# Patient Record
Sex: Female | Born: 1950 | Race: White | Hispanic: No | Smoking: Current every day smoker
Health system: Southern US, Community
[De-identification: ages and names within clinical notes are randomized; demographics above are authoritative.]

## PROBLEM LIST (undated history)

## (undated) DIAGNOSIS — F419 Anxiety disorder, unspecified: Secondary | ICD-10-CM

## (undated) DIAGNOSIS — G43909 Migraine, unspecified, not intractable, without status migrainosus: Secondary | ICD-10-CM

## (undated) DIAGNOSIS — I6522 Occlusion and stenosis of left carotid artery: Secondary | ICD-10-CM

## (undated) DIAGNOSIS — F329 Major depressive disorder, single episode, unspecified: Secondary | ICD-10-CM

## (undated) DIAGNOSIS — G2581 Restless legs syndrome: Secondary | ICD-10-CM

## (undated) DIAGNOSIS — M1990 Unspecified osteoarthritis, unspecified site: Secondary | ICD-10-CM

## (undated) DIAGNOSIS — F32A Depression, unspecified: Secondary | ICD-10-CM

## (undated) DIAGNOSIS — E7800 Pure hypercholesterolemia, unspecified: Secondary | ICD-10-CM

## (undated) DIAGNOSIS — E039 Hypothyroidism, unspecified: Secondary | ICD-10-CM

## (undated) DIAGNOSIS — I10 Essential (primary) hypertension: Secondary | ICD-10-CM

## (undated) HISTORY — DX: Occlusion and stenosis of left carotid artery: I65.22

## (undated) HISTORY — PX: CHOLECYSTECTOMY: SHX55

## (undated) HISTORY — DX: Pure hypercholesterolemia, unspecified: E78.00

## (undated) HISTORY — DX: Migraine, unspecified, not intractable, without status migrainosus: G43.909

## (undated) HISTORY — PX: ABDOMINAL HYSTERECTOMY: SHX81

## (undated) HISTORY — DX: Anxiety disorder, unspecified: F41.9

## (undated) HISTORY — PX: TONSILLECTOMY: SUR1361

---

## 2000-02-04 HISTORY — PX: TOTAL KNEE ARTHROPLASTY: SHX125

## 2014-05-02 DIAGNOSIS — Z79899 Other long term (current) drug therapy: Secondary | ICD-10-CM | POA: Diagnosis not present

## 2014-05-02 DIAGNOSIS — F411 Generalized anxiety disorder: Secondary | ICD-10-CM | POA: Diagnosis not present

## 2014-05-02 DIAGNOSIS — G43001 Migraine without aura, not intractable, with status migrainosus: Secondary | ICD-10-CM | POA: Diagnosis not present

## 2014-05-02 DIAGNOSIS — M25562 Pain in left knee: Secondary | ICD-10-CM | POA: Diagnosis not present

## 2014-05-02 DIAGNOSIS — E039 Hypothyroidism, unspecified: Secondary | ICD-10-CM | POA: Diagnosis not present

## 2014-05-08 DIAGNOSIS — M25562 Pain in left knee: Secondary | ICD-10-CM | POA: Diagnosis not present

## 2014-05-08 DIAGNOSIS — M1712 Unilateral primary osteoarthritis, left knee: Secondary | ICD-10-CM | POA: Diagnosis not present

## 2014-05-12 DIAGNOSIS — M1712 Unilateral primary osteoarthritis, left knee: Secondary | ICD-10-CM | POA: Diagnosis not present

## 2014-05-12 DIAGNOSIS — M179 Osteoarthritis of knee, unspecified: Secondary | ICD-10-CM | POA: Diagnosis not present

## 2014-05-12 DIAGNOSIS — M25562 Pain in left knee: Secondary | ICD-10-CM | POA: Diagnosis not present

## 2014-05-15 DIAGNOSIS — M2342 Loose body in knee, left knee: Secondary | ICD-10-CM | POA: Diagnosis not present

## 2014-05-15 DIAGNOSIS — M1712 Unilateral primary osteoarthritis, left knee: Secondary | ICD-10-CM | POA: Diagnosis not present

## 2014-05-24 DIAGNOSIS — X58XXXA Exposure to other specified factors, initial encounter: Secondary | ICD-10-CM | POA: Diagnosis not present

## 2014-05-24 DIAGNOSIS — M94262 Chondromalacia, left knee: Secondary | ICD-10-CM | POA: Diagnosis not present

## 2014-05-24 DIAGNOSIS — Y929 Unspecified place or not applicable: Secondary | ICD-10-CM | POA: Diagnosis not present

## 2014-05-24 DIAGNOSIS — S83232A Complex tear of medial meniscus, current injury, left knee, initial encounter: Secondary | ICD-10-CM | POA: Diagnosis not present

## 2014-06-05 DIAGNOSIS — R262 Difficulty in walking, not elsewhere classified: Secondary | ICD-10-CM | POA: Diagnosis not present

## 2014-06-05 DIAGNOSIS — M25562 Pain in left knee: Secondary | ICD-10-CM | POA: Diagnosis not present

## 2014-06-05 DIAGNOSIS — M23204 Derangement of unspecified medial meniscus due to old tear or injury, left knee: Secondary | ICD-10-CM | POA: Diagnosis not present

## 2014-06-08 DIAGNOSIS — M25562 Pain in left knee: Secondary | ICD-10-CM | POA: Diagnosis not present

## 2014-06-08 DIAGNOSIS — M23204 Derangement of unspecified medial meniscus due to old tear or injury, left knee: Secondary | ICD-10-CM | POA: Diagnosis not present

## 2014-06-08 DIAGNOSIS — R262 Difficulty in walking, not elsewhere classified: Secondary | ICD-10-CM | POA: Diagnosis not present

## 2014-06-13 DIAGNOSIS — M23204 Derangement of unspecified medial meniscus due to old tear or injury, left knee: Secondary | ICD-10-CM | POA: Diagnosis not present

## 2014-06-13 DIAGNOSIS — R262 Difficulty in walking, not elsewhere classified: Secondary | ICD-10-CM | POA: Diagnosis not present

## 2014-06-13 DIAGNOSIS — M25562 Pain in left knee: Secondary | ICD-10-CM | POA: Diagnosis not present

## 2014-07-20 DIAGNOSIS — M25562 Pain in left knee: Secondary | ICD-10-CM | POA: Diagnosis not present

## 2014-07-20 DIAGNOSIS — Z9889 Other specified postprocedural states: Secondary | ICD-10-CM | POA: Diagnosis not present

## 2014-09-25 ENCOUNTER — Other Ambulatory Visit: Payer: Self-pay | Admitting: Orthopedic Surgery

## 2014-10-05 ENCOUNTER — Encounter (HOSPITAL_COMMUNITY)
Admission: RE | Admit: 2014-10-05 | Discharge: 2014-10-05 | Disposition: A | Discharge status: Home / Self Care | Payer: Medicare HMO | Source: Ambulatory Visit | Attending: Orthopedic Surgery | Admitting: Orthopedic Surgery

## 2014-10-05 ENCOUNTER — Encounter (HOSPITAL_COMMUNITY): Payer: Self-pay

## 2014-10-05 ENCOUNTER — Ambulatory Visit (HOSPITAL_COMMUNITY)
Admission: RE | Admit: 2014-10-05 | Discharge: 2014-10-05 | Disposition: A | Discharge status: Home / Self Care | Payer: Medicare HMO | Source: Ambulatory Visit | Attending: Orthopedic Surgery | Admitting: Orthopedic Surgery

## 2014-10-05 DIAGNOSIS — Z01818 Encounter for other preprocedural examination: Secondary | ICD-10-CM | POA: Insufficient documentation

## 2014-10-05 DIAGNOSIS — F17200 Nicotine dependence, unspecified, uncomplicated: Secondary | ICD-10-CM | POA: Insufficient documentation

## 2014-10-05 DIAGNOSIS — R9431 Abnormal electrocardiogram [ECG] [EKG]: Secondary | ICD-10-CM | POA: Insufficient documentation

## 2014-10-05 DIAGNOSIS — Z7982 Long term (current) use of aspirin: Secondary | ICD-10-CM | POA: Diagnosis not present

## 2014-10-05 DIAGNOSIS — Z79899 Other long term (current) drug therapy: Secondary | ICD-10-CM | POA: Diagnosis not present

## 2014-10-05 DIAGNOSIS — Z01812 Encounter for preprocedural laboratory examination: Secondary | ICD-10-CM | POA: Insufficient documentation

## 2014-10-05 DIAGNOSIS — I10 Essential (primary) hypertension: Secondary | ICD-10-CM | POA: Diagnosis not present

## 2014-10-05 DIAGNOSIS — E039 Hypothyroidism, unspecified: Secondary | ICD-10-CM | POA: Diagnosis not present

## 2014-10-05 HISTORY — DX: Restless legs syndrome: G25.81

## 2014-10-05 HISTORY — DX: Depression, unspecified: F32.A

## 2014-10-05 HISTORY — DX: Hypothyroidism, unspecified: E03.9

## 2014-10-05 HISTORY — DX: Unspecified osteoarthritis, unspecified site: M19.90

## 2014-10-05 HISTORY — DX: Major depressive disorder, single episode, unspecified: F32.9

## 2014-10-05 HISTORY — DX: Essential (primary) hypertension: I10

## 2014-10-05 LAB — COMPREHENSIVE METABOLIC PANEL
ALK PHOS: 134 U/L — AB (ref 38–126)
ALT: 17 U/L (ref 14–54)
AST: 28 U/L (ref 15–41)
Albumin: 4.1 g/dL (ref 3.5–5.0)
Anion gap: 10 (ref 5–15)
BILIRUBIN TOTAL: 0.6 mg/dL (ref 0.3–1.2)
BUN: 26 mg/dL — AB (ref 6–20)
CALCIUM: 9.9 mg/dL (ref 8.9–10.3)
CO2: 24 mmol/L (ref 22–32)
Chloride: 104 mmol/L (ref 101–111)
Creatinine, Ser: 1.99 mg/dL — ABNORMAL HIGH (ref 0.44–1.00)
GFR calc Af Amer: 29 mL/min — ABNORMAL LOW (ref >60)
GFR, EST NON AFRICAN AMERICAN: 25 mL/min — AB (ref >60)
GLUCOSE: 105 mg/dL — AB (ref 65–99)
POTASSIUM: 3.6 mmol/L (ref 3.5–5.1)
Sodium: 138 mmol/L (ref 135–145)
TOTAL PROTEIN: 8 g/dL (ref 6.5–8.1)

## 2014-10-05 LAB — URINALYSIS, ROUTINE W REFLEX MICROSCOPIC
BILIRUBIN URINE: NEGATIVE
GLUCOSE, UA: NEGATIVE mg/dL
Ketones, ur: NEGATIVE mg/dL
Nitrite: POSITIVE — AB
PH: 5.5 (ref 5.0–8.0)
Protein, ur: NEGATIVE mg/dL
SPECIFIC GRAVITY, URINE: 1.014 (ref 1.005–1.030)
UROBILINOGEN UA: 0.2 mg/dL (ref 0.0–1.0)

## 2014-10-05 LAB — CBC WITH DIFFERENTIAL/PLATELET
BASOS ABS: 0 10*3/uL (ref 0.0–0.1)
Basophils Relative: 0 % (ref 0–1)
Eosinophils Absolute: 0.1 10*3/uL (ref 0.0–0.7)
Eosinophils Relative: 1 % (ref 0–5)
HEMATOCRIT: 40.7 % (ref 36.0–46.0)
HEMOGLOBIN: 13.9 g/dL (ref 12.0–15.0)
LYMPHS PCT: 30 % (ref 12–46)
Lymphs Abs: 2.4 10*3/uL (ref 0.7–4.0)
MCH: 31.8 pg (ref 26.0–34.0)
MCHC: 34.2 g/dL (ref 30.0–36.0)
MCV: 93.1 fL (ref 78.0–100.0)
MONO ABS: 0.7 10*3/uL (ref 0.1–1.0)
MONOS PCT: 9 % (ref 3–12)
NEUTROS ABS: 4.7 10*3/uL (ref 1.7–7.7)
NEUTROS PCT: 60 % (ref 43–77)
Platelets: 349 10*3/uL (ref 150–400)
RBC: 4.37 MIL/uL (ref 3.87–5.11)
RDW: 14.1 % (ref 11.5–15.5)
WBC: 7.9 10*3/uL (ref 4.0–10.5)

## 2014-10-05 LAB — URINE MICROSCOPIC-ADD ON

## 2014-10-05 LAB — SURGICAL PCR SCREEN
MRSA, PCR: NEGATIVE
Staphylococcus aureus: POSITIVE — AB

## 2014-10-05 LAB — PROTIME-INR
INR: 1.1 (ref 0.00–1.49)
PROTHROMBIN TIME: 14.4 s (ref 11.6–15.2)

## 2014-10-05 LAB — APTT: aPTT: 35 seconds (ref 24–37)

## 2014-10-05 NOTE — Pre-Procedure Instructions (Signed)
Shelley Underwood  10/05/2014     No Pharmacies Listed   Your procedure is scheduled on  Monday  10/16/14  Report to [facility] Admitting at 645 A.M.  Call this number if you have problems the morning of surgery:  [phone]   Remember:  Do not eat food or drink liquids after midnight Sunday Sept. 11  Take these medicines the morning of surgery with A SIP OF WATER :wellbutrin, lexapro,levothyroxine, metoprolol,    Do not wear jewelry, make-up or nail polish.  Do not wear lotions, powders, or perfumes.  You may wear deodorant.  Do not shave 48 hours prior to surgery.  Men may shave face and neck.  Do not bring valuables to the hospital.  [facility] is not responsible for any belongings or valuables.  Contacts, dentures or bridgework may not be worn into surgery.  Leave your suitcase in the car.  After surgery it may be brought to your room.  For patients admitted to the hospital, discharge time will be determined by your treatment team.  Patients discharged the day of surgery will not be allowed to drive home.   Name and phone number of your driver:   Special instructions:  [facility] - Preparing for Surgery  Before surgery, you can play an important role.  Because skin is not sterile, your skin needs to be as free of germs as possible.  You can reduce the number of germs on you skin by washing with CHG (chlorahexidine gluconate) soap before surgery.  CHG is an antiseptic cleaner which kills germs and bonds with the skin to continue killing germs even after washing.  Please DO NOT use if you have an allergy to CHG or antibacterial soaps.  If your skin becomes reddened/irritated stop using the CHG and inform your nurse when you arrive at Short Stay.  Do not shave (including legs and underarms) for at least 48 hours prior to the first CHG shower.  You may shave your face.  Please follow these instructions carefully:   1.  Shower with CHG Soap the night before  surgery and the                                morning of Surgery.  2.  If you choose to wash your hair, wash your hair first as usual with your       normal shampoo.  3.  After you shampoo, rinse your hair and body thoroughly to remove the                      Shampoo.  4.  Use CHG as you would any other liquid soap.  You can apply chg directly       to the skin and wash gently with scrungie or a clean washcloth.  5.  Apply the CHG Soap to your body ONLY FROM THE NECK DOWN.        Do not use on open wounds or open sores.  Avoid contact with your eyes,       ears, mouth and genitals (private parts).  Wash genitals (private parts)       with your normal soap.  6.  Wash thoroughly, paying special attention to the area where your surgery        will be performed.  7.  Thoroughly rinse your body with warm water from the  neck down.  8.  DO NOT shower/wash with your normal soap after using and rinsing off       the CHG Soap.  9.  Pat yourself dry with a clean towel.            10.  Wear clean pajamas.            11.  Place clean sheets on your bed the night of your first shower and do not        sleep with pets.  Day of Surgery  Do not apply any lotions/deoderants the morning of surgery.  Please wear clean clothes to the hospital/surgery center.    Please read over the following fact sheets that you were given. Pain Booklet, Coughing and Deep Breathing, MRSA Information and Surgical Site Infection Prevention

## 2014-10-05 NOTE — Progress Notes (Signed)
PCP: Dr. Valentino Nose [facility] in [facility]  Pt. States she saw a Dr. Louis Meckel, [facility] in [place]  5-6 yrs. Ago for  irregular heartbeat. Had echo/stress and sent to [facility] for cath. States everything normal and has not seen this doctor since. Will request records.

## 2014-10-06 ENCOUNTER — Encounter (HOSPITAL_COMMUNITY): Payer: Self-pay | Admitting: Emergency Medicine

## 2014-10-06 NOTE — Progress Notes (Signed)
Anesthesia Chart Review:  Pt is 64 year old female scheduled for L total knee arthroplasty on 10/16/2014 with Dr. Sherlean Foot.     PMH includes: HTN, hypothyroidism. Current smoker. BMI 37.   Medications include: ASA, lipitor, levothyroxine, metoprolol, mirapex, maxzide.   Preoperative labs reviewed.  Cr 1.99, BUN 26. Attempting to get comparison labs from PCP's office. Pt has UTI, notified receptionist in Dr. Tobin Chad office who will report result to PA.   Chest x-ray 10/05/2014 reviewed. There is no active cardiopulmonary disease.   EKG 10/05/2014: NSR. Nonspecific T wave abnormality  Pt reports had cardiac testing 5-6 years ago. Attempting to obtain records.   Will revisit chart when more information available.   Rica Mast, FNP-BC [facility]/Anesthesiology Phone: [phone] 10/06/2014 2:56 PM

## 2014-10-07 LAB — URINE CULTURE

## 2014-10-16 ENCOUNTER — Inpatient Hospital Stay (HOSPITAL_COMMUNITY): Admission: RE | Admit: 2014-10-16 | Payer: Medicare HMO | Source: Ambulatory Visit | Admitting: Orthopedic Surgery

## 2014-10-16 ENCOUNTER — Encounter (HOSPITAL_COMMUNITY): Admission: RE | Payer: Self-pay | Source: Ambulatory Visit

## 2014-10-16 SURGERY — ARTHROPLASTY, KNEE, TOTAL
Anesthesia: Spinal | Laterality: Left

## 2014-10-20 ENCOUNTER — Other Ambulatory Visit: Payer: Self-pay | Admitting: Orthopedic Surgery

## 2014-10-27 ENCOUNTER — Encounter (HOSPITAL_COMMUNITY)
Admission: RE | Admit: 2014-10-27 | Discharge: 2014-10-27 | Disposition: A | Discharge status: Home / Self Care | Payer: Medicare HMO | Source: Ambulatory Visit | Attending: Orthopedic Surgery | Admitting: Orthopedic Surgery

## 2014-10-27 ENCOUNTER — Encounter (HOSPITAL_COMMUNITY): Payer: Self-pay

## 2014-10-27 DIAGNOSIS — M1712 Unilateral primary osteoarthritis, left knee: Secondary | ICD-10-CM | POA: Insufficient documentation

## 2014-10-27 DIAGNOSIS — Z01812 Encounter for preprocedural laboratory examination: Secondary | ICD-10-CM | POA: Insufficient documentation

## 2014-10-27 LAB — COMPREHENSIVE METABOLIC PANEL
ALT: 11 U/L — ABNORMAL LOW (ref 14–54)
ANION GAP: 7 (ref 5–15)
AST: 21 U/L (ref 15–41)
Albumin: 3.5 g/dL (ref 3.5–5.0)
Alkaline Phosphatase: 102 U/L (ref 38–126)
BILIRUBIN TOTAL: 0.3 mg/dL (ref 0.3–1.2)
BUN: 13 mg/dL (ref 6–20)
CO2: 22 mmol/L (ref 22–32)
Calcium: 9.3 mg/dL (ref 8.9–10.3)
Chloride: 110 mmol/L (ref 101–111)
Creatinine, Ser: 0.99 mg/dL (ref 0.44–1.00)
GFR, EST NON AFRICAN AMERICAN: 59 mL/min — AB (ref >60)
Glucose, Bld: 96 mg/dL (ref 65–99)
POTASSIUM: 4.9 mmol/L (ref 3.5–5.1)
Sodium: 139 mmol/L (ref 135–145)
TOTAL PROTEIN: 6.6 g/dL (ref 6.5–8.1)

## 2014-10-27 LAB — CBC WITH DIFFERENTIAL/PLATELET
Basophils Absolute: 0 10*3/uL (ref 0.0–0.1)
Basophils Relative: 0 %
EOS PCT: 2 %
Eosinophils Absolute: 0.1 10*3/uL (ref 0.0–0.7)
HEMATOCRIT: 36.1 % (ref 36.0–46.0)
Hemoglobin: 12 g/dL (ref 12.0–15.0)
LYMPHS ABS: 2.3 10*3/uL (ref 0.7–4.0)
LYMPHS PCT: 30 %
MCH: 31.9 pg (ref 26.0–34.0)
MCHC: 33.2 g/dL (ref 30.0–36.0)
MCV: 96 fL (ref 78.0–100.0)
MONO ABS: 0.4 10*3/uL (ref 0.1–1.0)
MONOS PCT: 6 %
NEUTROS ABS: 4.8 10*3/uL (ref 1.7–7.7)
Neutrophils Relative %: 62 %
Platelets: 247 10*3/uL (ref 150–400)
RBC: 3.76 MIL/uL — ABNORMAL LOW (ref 3.87–5.11)
RDW: 14.1 % (ref 11.5–15.5)
WBC: 7.7 10*3/uL (ref 4.0–10.5)

## 2014-10-27 LAB — URINALYSIS, ROUTINE W REFLEX MICROSCOPIC
BILIRUBIN URINE: NEGATIVE
Glucose, UA: NEGATIVE mg/dL
HGB URINE DIPSTICK: NEGATIVE
Ketones, ur: NEGATIVE mg/dL
Nitrite: NEGATIVE
Protein, ur: NEGATIVE mg/dL
SPECIFIC GRAVITY, URINE: 1.025 (ref 1.005–1.030)
UROBILINOGEN UA: 0.2 mg/dL (ref 0.0–1.0)
pH: 5 (ref 5.0–8.0)

## 2014-10-27 LAB — URINE MICROSCOPIC-ADD ON

## 2014-10-27 LAB — PROTIME-INR
INR: 0.99 (ref 0.00–1.49)
PROTHROMBIN TIME: 13.3 s (ref 11.6–15.2)

## 2014-10-27 LAB — SURGICAL PCR SCREEN
MRSA, PCR: NEGATIVE
Staphylococcus aureus: NEGATIVE

## 2014-10-27 LAB — APTT: aPTT: 35 seconds (ref 24–37)

## 2014-10-27 NOTE — Progress Notes (Signed)
Pt. Does not have cardiologist. PCP: Dr. Leonor Liv at [facility] in [place]  Stated the echo/stress cath was done over 10 yrs. Ago, normal.

## 2014-10-27 NOTE — Pre-Procedure Instructions (Signed)
    Shelley Underwood  10/27/2014      [facility] - [place], [place] - [address] [place] [address] Phone: [phone] Fax: [phone]    Your procedure is scheduled on Monday, Oct. 3  Report to [facility] Admitting at 6:45 A.M.  Call this number if you have problems the morning of surgery:  [phone]              Any questions prior to surgery [phone] (Monday-Friday 8am-4pm)   Remember:  Do not eat food or drink liquids after midnight.  Take these medicines the morning of surgery with A SIP OF WATER : bupropion (wellbutrin), escitalopram (lexapro), levothyroxine (synthroid), metoprolol(lopressor)              Stop aspirin,omega-3 acid (lovaza) 1 week prior to surgery. Also stop nonsteroidal anti-inflammatoy medications: advil, motrin, ibuprofen, BC'S   Do not wear jewelry, make-up or nail polish.  Do not wear lotions, powders, or perfumes.  You may wear deodorant.  Do not shave 48 hours prior to surgery.  Men may shave face and neck.  Do not bring valuables to the hospital.  [facility] is not responsible for any belongings or valuables.  Contacts, dentures or bridgework may not be worn into surgery.  Leave your suitcase in the car.  After surgery it may be brought to your room.  For patients admitted to the hospital, discharge time will be determined by your treatment team.  Patients discharged the day of surgery will not be allowed to drive home.   Name and phone number of your driver:    Special instructions: review preparing for surgery handout  Please read over the following fact sheets that you were given. Pain Booklet, Coughing and Deep Breathing, MRSA Information and Surgical Site Infection Prevention

## 2014-10-28 LAB — URINE CULTURE

## 2014-11-05 MED ORDER — BUPIVACAINE LIPOSOME 1.3 % IJ SUSP
20.0000 mL | Freq: Once | INTRAMUSCULAR | Status: AC
  Administered 2014-11-06: 20 mL
  Filled 2014-11-05: qty 20

## 2014-11-05 MED ORDER — TRANEXAMIC ACID 1000 MG/10ML IV SOLN
1000.0000 mg | INTRAVENOUS | Status: AC
  Administered 2014-11-06: 1000 mg via INTRAVENOUS
  Filled 2014-11-05: qty 10

## 2014-11-06 ENCOUNTER — Encounter (HOSPITAL_COMMUNITY)
Admission: RE | Disposition: A | Discharge status: Home Health | Payer: Self-pay | Source: Ambulatory Visit | Attending: Orthopedic Surgery

## 2014-11-06 ENCOUNTER — Inpatient Hospital Stay (HOSPITAL_COMMUNITY): Payer: Medicare HMO | Admitting: Certified Registered Nurse Anesthetist

## 2014-11-06 ENCOUNTER — Inpatient Hospital Stay (HOSPITAL_COMMUNITY)
Admission: RE | Admit: 2014-11-06 | Discharge: 2014-11-08 | DRG: 470 | Disposition: A | Discharge status: Home Health | Payer: Medicare HMO | Source: Ambulatory Visit | Attending: Orthopedic Surgery | Admitting: Orthopedic Surgery

## 2014-11-06 DIAGNOSIS — Z96659 Presence of unspecified artificial knee joint: Secondary | ICD-10-CM

## 2014-11-06 DIAGNOSIS — Z96651 Presence of right artificial knee joint: Secondary | ICD-10-CM | POA: Diagnosis present

## 2014-11-06 DIAGNOSIS — D62 Acute posthemorrhagic anemia: Secondary | ICD-10-CM | POA: Diagnosis not present

## 2014-11-06 DIAGNOSIS — J449 Chronic obstructive pulmonary disease, unspecified: Secondary | ICD-10-CM | POA: Diagnosis present

## 2014-11-06 DIAGNOSIS — M1712 Unilateral primary osteoarthritis, left knee: Secondary | ICD-10-CM | POA: Diagnosis present

## 2014-11-06 DIAGNOSIS — Z6838 Body mass index (BMI) 38.0-38.9, adult: Secondary | ICD-10-CM

## 2014-11-06 DIAGNOSIS — F17210 Nicotine dependence, cigarettes, uncomplicated: Secondary | ICD-10-CM | POA: Diagnosis present

## 2014-11-06 DIAGNOSIS — I10 Essential (primary) hypertension: Secondary | ICD-10-CM | POA: Diagnosis present

## 2014-11-06 DIAGNOSIS — E039 Hypothyroidism, unspecified: Secondary | ICD-10-CM | POA: Diagnosis not present

## 2014-11-06 DIAGNOSIS — Z880 Allergy status to penicillin: Secondary | ICD-10-CM

## 2014-11-06 DIAGNOSIS — Z885 Allergy status to narcotic agent status: Secondary | ICD-10-CM | POA: Diagnosis not present

## 2014-11-06 DIAGNOSIS — Z881 Allergy status to other antibiotic agents status: Secondary | ICD-10-CM | POA: Diagnosis not present

## 2014-11-06 DIAGNOSIS — G2581 Restless legs syndrome: Secondary | ICD-10-CM | POA: Diagnosis not present

## 2014-11-06 DIAGNOSIS — M25562 Pain in left knee: Secondary | ICD-10-CM | POA: Diagnosis present

## 2014-11-06 HISTORY — PX: TOTAL KNEE ARTHROPLASTY: SHX125

## 2014-11-06 LAB — CBC
HEMATOCRIT: 35.8 % — AB (ref 36.0–46.0)
HEMOGLOBIN: 11.4 g/dL — AB (ref 12.0–15.0)
MCH: 30.4 pg (ref 26.0–34.0)
MCHC: 31.8 g/dL (ref 30.0–36.0)
MCV: 95.5 fL (ref 78.0–100.0)
Platelets: 271 10*3/uL (ref 150–400)
RBC: 3.75 MIL/uL — ABNORMAL LOW (ref 3.87–5.11)
RDW: 14.1 % (ref 11.5–15.5)
WBC: 9.3 10*3/uL (ref 4.0–10.5)

## 2014-11-06 LAB — CREATININE, SERUM
Creatinine, Ser: 0.89 mg/dL (ref 0.44–1.00)
GFR calc Af Amer: >60 mL/min (ref >60)

## 2014-11-06 SURGERY — ARTHROPLASTY, KNEE, TOTAL
Anesthesia: Spinal | Site: Knee | Laterality: Left

## 2014-11-06 MED ORDER — PHENYLEPHRINE 40 MCG/ML (10ML) SYRINGE FOR IV PUSH (FOR BLOOD PRESSURE SUPPORT)
PREFILLED_SYRINGE | INTRAVENOUS | Status: AC
  Filled 2014-11-06: qty 10

## 2014-11-06 MED ORDER — SENNOSIDES-DOCUSATE SODIUM 8.6-50 MG PO TABS: 1.0000 | ORAL_TABLET | Freq: Every evening | ORAL | Status: DC | PRN

## 2014-11-06 MED ORDER — PROPOFOL 10 MG/ML IV BOLUS
INTRAVENOUS | Status: AC
  Filled 2014-11-06: qty 20

## 2014-11-06 MED ORDER — CELECOXIB 200 MG PO CAPS
200.0000 mg | ORAL_CAPSULE | Freq: Two times a day (BID) | ORAL | Status: DC
Start: 2014-11-06 — End: 2014-11-08
  Administered 2014-11-06 – 2014-11-08 (×5): 200 mg via ORAL
  Filled 2014-11-06 (×5): qty 1

## 2014-11-06 MED ORDER — ONDANSETRON HCL 4 MG/2ML IJ SOLN
INTRAMUSCULAR | Status: DC | PRN
  Administered 2014-11-06: 4 mg via INTRAVENOUS

## 2014-11-06 MED ORDER — MIDAZOLAM HCL 2 MG/2ML IJ SOLN
INTRAMUSCULAR | Status: AC
  Filled 2014-11-06: qty 4

## 2014-11-06 MED ORDER — DOCUSATE SODIUM 100 MG PO CAPS
100.0000 mg | ORAL_CAPSULE | Freq: Two times a day (BID) | ORAL | Status: DC
Start: 2014-11-06 — End: 2014-11-08
  Administered 2014-11-06 – 2014-11-08 (×5): 100 mg via ORAL
  Filled 2014-11-06 (×5): qty 1

## 2014-11-06 MED ORDER — MENTHOL 3 MG MT LOZG: 1.0000 | LOZENGE | OROMUCOSAL | Status: DC | PRN

## 2014-11-06 MED ORDER — HYDROMORPHONE HCL 1 MG/ML IJ SOLN
0.2500 mg | INTRAMUSCULAR | Status: DC | PRN
  Administered 2014-11-06: 0.5 mg via INTRAVENOUS

## 2014-11-06 MED ORDER — ACETAMINOPHEN 650 MG RE SUPP: 650.0000 mg | Freq: Four times a day (QID) | RECTAL | Status: DC | PRN

## 2014-11-06 MED ORDER — HYDROMORPHONE HCL 1 MG/ML IJ SOLN
INTRAMUSCULAR | Status: AC
  Filled 2014-11-06: qty 1

## 2014-11-06 MED ORDER — CHLORHEXIDINE GLUCONATE 4 % EX LIQD: 60.0000 mL | Freq: Once | CUTANEOUS | Status: DC

## 2014-11-06 MED ORDER — ACETAMINOPHEN 325 MG PO TABS: 650.0000 mg | ORAL_TABLET | Freq: Four times a day (QID) | ORAL | Status: DC | PRN

## 2014-11-06 MED ORDER — PRAMIPEXOLE DIHYDROCHLORIDE 0.125 MG PO TABS
0.5000 mg | ORAL_TABLET | Freq: Every day | ORAL | Status: DC
  Administered 2014-11-06 – 2014-11-07 (×2): 0.5 mg via ORAL
  Filled 2014-11-06 (×2): qty 4

## 2014-11-06 MED ORDER — BUPROPION HCL ER (XL) 150 MG PO TB24
150.0000 mg | ORAL_TABLET | Freq: Every day | ORAL | Status: DC
  Administered 2014-11-07 – 2014-11-08 (×2): 150 mg via ORAL
  Filled 2014-11-06 (×2): qty 1

## 2014-11-06 MED ORDER — ATORVASTATIN CALCIUM 10 MG PO TABS
20.0000 mg | ORAL_TABLET | Freq: Every day | ORAL | Status: DC
  Administered 2014-11-06 – 2014-11-07 (×2): 20 mg via ORAL
  Filled 2014-11-06 (×2): qty 2

## 2014-11-06 MED ORDER — HYDROMORPHONE HCL 1 MG/ML IJ SOLN
0.2500 mg | INTRAMUSCULAR | Status: DC | PRN
  Administered 2014-11-06 (×4): 0.5 mg via INTRAVENOUS

## 2014-11-06 MED ORDER — METOCLOPRAMIDE HCL 5 MG/ML IJ SOLN: 5.0000 mg | Freq: Three times a day (TID) | INTRAMUSCULAR | Status: DC | PRN

## 2014-11-06 MED ORDER — FENTANYL CITRATE (PF) 250 MCG/5ML IJ SOLN
INTRAMUSCULAR | Status: AC
  Filled 2014-11-06: qty 5

## 2014-11-06 MED ORDER — SODIUM CHLORIDE 0.9 % IV SOLN
INTRAVENOUS | Status: DC
Start: 2014-11-06 — End: 2014-11-08
  Administered 2014-11-07: 02:00:00 via INTRAVENOUS

## 2014-11-06 MED ORDER — ALUM & MAG HYDROXIDE-SIMETH 200-200-20 MG/5ML PO SUSP: 30.0000 mL | ORAL | Status: DC | PRN

## 2014-11-06 MED ORDER — METHOCARBAMOL 500 MG PO TABS
500.0000 mg | ORAL_TABLET | Freq: Four times a day (QID) | ORAL | Status: DC | PRN
  Administered 2014-11-06 – 2014-11-08 (×7): 500 mg via ORAL
  Filled 2014-11-06 (×7): qty 1

## 2014-11-06 MED ORDER — ONDANSETRON HCL 4 MG/2ML IJ SOLN: 4.0000 mg | Freq: Four times a day (QID) | INTRAMUSCULAR | Status: DC | PRN

## 2014-11-06 MED ORDER — LACTATED RINGERS IV SOLN
INTRAVENOUS | Status: DC | PRN
  Administered 2014-11-06: 09:00:00 via INTRAVENOUS

## 2014-11-06 MED ORDER — ENOXAPARIN SODIUM 30 MG/0.3ML ~~LOC~~ SOLN
30.0000 mg | Freq: Two times a day (BID) | SUBCUTANEOUS | Status: DC
  Administered 2014-11-07 – 2014-11-08 (×3): 30 mg via SUBCUTANEOUS
  Filled 2014-11-06 (×3): qty 0.3

## 2014-11-06 MED ORDER — ONDANSETRON HCL 4 MG/2ML IJ SOLN
INTRAMUSCULAR | Status: AC
  Filled 2014-11-06: qty 2

## 2014-11-06 MED ORDER — BUPIVACAINE-EPINEPHRINE 0.5% -1:200000 IJ SOLN
INTRAMUSCULAR | Status: DC | PRN
  Administered 2014-11-06: 50 mL

## 2014-11-06 MED ORDER — DEXAMETHASONE SODIUM PHOSPHATE 4 MG/ML IJ SOLN
INTRAMUSCULAR | Status: DC | PRN
  Administered 2014-11-06: 4 mg via INTRAVENOUS

## 2014-11-06 MED ORDER — INFLUENZA VAC SPLIT QUAD 0.5 ML IM SUSY
0.5000 mL | PREFILLED_SYRINGE | INTRAMUSCULAR | Status: AC
  Administered 2014-11-07: 0.5 mL via INTRAMUSCULAR
  Filled 2014-11-06: qty 0.5

## 2014-11-06 MED ORDER — HYDROMORPHONE HCL 1 MG/ML IJ SOLN
1.0000 mg | INTRAMUSCULAR | Status: DC | PRN
  Administered 2014-11-06 – 2014-11-08 (×17): 1 mg via INTRAVENOUS
  Filled 2014-11-06 (×18): qty 1

## 2014-11-06 MED ORDER — MIDAZOLAM HCL 5 MG/5ML IJ SOLN
INTRAMUSCULAR | Status: DC | PRN
  Administered 2014-11-06: 2 mg via INTRAVENOUS
  Administered 2014-11-06 (×2): 1 mg via INTRAVENOUS

## 2014-11-06 MED ORDER — MIDAZOLAM HCL 2 MG/2ML IJ SOLN
0.5000 mg | Freq: Once | INTRAMUSCULAR | Status: AC | PRN
  Administered 2014-11-06: 2 mg via INTRAVENOUS

## 2014-11-06 MED ORDER — METOCLOPRAMIDE HCL 5 MG PO TABS: 5.0000 mg | ORAL_TABLET | Freq: Three times a day (TID) | ORAL | Status: DC | PRN

## 2014-11-06 MED ORDER — EPHEDRINE SULFATE 50 MG/ML IJ SOLN
INTRAMUSCULAR | Status: DC | PRN
  Administered 2014-11-06: 10 mg via INTRAVENOUS
  Administered 2014-11-06 (×2): 5 mg via INTRAVENOUS
  Administered 2014-11-06: 10 mg via INTRAVENOUS

## 2014-11-06 MED ORDER — CEFAZOLIN SODIUM-DEXTROSE 2-3 GM-% IV SOLR
INTRAVENOUS | Status: AC
  Filled 2014-11-06: qty 50

## 2014-11-06 MED ORDER — FENTANYL CITRATE (PF) 100 MCG/2ML IJ SOLN
INTRAMUSCULAR | Status: DC | PRN
  Administered 2014-11-06: 25 ug via INTRAVENOUS
  Administered 2014-11-06 (×2): 50 ug via INTRAVENOUS
  Administered 2014-11-06: 200 ug via INTRAVENOUS
  Administered 2014-11-06: 25 ug via INTRAVENOUS
  Administered 2014-11-06 (×2): 50 ug via INTRAVENOUS
  Administered 2014-11-06 (×2): 25 ug via INTRAVENOUS

## 2014-11-06 MED ORDER — MIDAZOLAM HCL 2 MG/2ML IJ SOLN
INTRAMUSCULAR | Status: AC
  Filled 2014-11-06: qty 2

## 2014-11-06 MED ORDER — HYDROCODONE-ACETAMINOPHEN 10-325 MG PO TABS
1.0000 | ORAL_TABLET | ORAL | Status: DC | PRN
  Administered 2014-11-06 (×3): 1 via ORAL
  Administered 2014-11-07 – 2014-11-08 (×8): 2 via ORAL
  Filled 2014-11-06: qty 2
  Filled 2014-11-06 (×2): qty 1
  Filled 2014-11-06 (×8): qty 2

## 2014-11-06 MED ORDER — ROCURONIUM BROMIDE 50 MG/5ML IV SOLN
INTRAVENOUS | Status: AC
  Filled 2014-11-06: qty 1

## 2014-11-06 MED ORDER — STERILE WATER FOR INJECTION IJ SOLN
INTRAMUSCULAR | Status: AC
  Filled 2014-11-06: qty 10

## 2014-11-06 MED ORDER — ARTIFICIAL TEARS OP OINT
TOPICAL_OINTMENT | OPHTHALMIC | Status: AC
  Filled 2014-11-06: qty 3.5

## 2014-11-06 MED ORDER — METOPROLOL TARTRATE 100 MG PO TABS
100.0000 mg | ORAL_TABLET | Freq: Every day | ORAL | Status: DC
  Administered 2014-11-07 – 2014-11-08 (×2): 100 mg via ORAL
  Filled 2014-11-06 (×2): qty 1

## 2014-11-06 MED ORDER — SODIUM CHLORIDE 0.9 % IR SOLN
Status: DC | PRN
  Administered 2014-11-06: 3000 mL

## 2014-11-06 MED ORDER — DEXAMETHASONE SODIUM PHOSPHATE 4 MG/ML IJ SOLN
INTRAMUSCULAR | Status: AC
  Filled 2014-11-06: qty 1

## 2014-11-06 MED ORDER — LIDOCAINE HCL (CARDIAC) 20 MG/ML IV SOLN
INTRAVENOUS | Status: AC
  Filled 2014-11-06: qty 5

## 2014-11-06 MED ORDER — PROMETHAZINE HCL 25 MG/ML IJ SOLN
6.2500 mg | INTRAMUSCULAR | Status: DC | PRN
  Administered 2014-11-06: 6.25 mg via INTRAVENOUS

## 2014-11-06 MED ORDER — EPHEDRINE SULFATE 50 MG/ML IJ SOLN
INTRAMUSCULAR | Status: AC
  Filled 2014-11-06: qty 1

## 2014-11-06 MED ORDER — SUCCINYLCHOLINE CHLORIDE 20 MG/ML IJ SOLN
INTRAMUSCULAR | Status: AC
  Filled 2014-11-06: qty 1

## 2014-11-06 MED ORDER — CEFAZOLIN SODIUM-DEXTROSE 2-3 GM-% IV SOLR
2.0000 g | INTRAVENOUS | Status: AC
  Administered 2014-11-06: 2 g via INTRAVENOUS

## 2014-11-06 MED ORDER — ZOLPIDEM TARTRATE 5 MG PO TABS: 5.0000 mg | ORAL_TABLET | Freq: Every evening | ORAL | Status: DC | PRN

## 2014-11-06 MED ORDER — LEVOTHYROXINE SODIUM 112 MCG PO TABS
112.0000 ug | ORAL_TABLET | Freq: Every day | ORAL | Status: DC
  Administered 2014-11-07 – 2014-11-08 (×2): 112 ug via ORAL
  Filled 2014-11-06 (×2): qty 1

## 2014-11-06 MED ORDER — HYDROMORPHONE HCL 1 MG/ML IJ SOLN: 0.5000 mg | INTRAMUSCULAR | Status: DC | PRN

## 2014-11-06 MED ORDER — MEPERIDINE HCL 25 MG/ML IJ SOLN: 6.2500 mg | INTRAMUSCULAR | Status: DC | PRN

## 2014-11-06 MED ORDER — FLEET ENEMA 7-19 GM/118ML RE ENEM: 1.0000 | ENEMA | Freq: Once | RECTAL | Status: DC | PRN

## 2014-11-06 MED ORDER — PHENYLEPHRINE HCL 10 MG/ML IJ SOLN
INTRAMUSCULAR | Status: DC | PRN
  Administered 2014-11-06 (×2): 40 ug via INTRAVENOUS
  Administered 2014-11-06: 80 ug via INTRAVENOUS

## 2014-11-06 MED ORDER — BISACODYL 5 MG PO TBEC: 5.0000 mg | DELAYED_RELEASE_TABLET | Freq: Every day | ORAL | Status: DC | PRN

## 2014-11-06 MED ORDER — PROMETHAZINE HCL 25 MG/ML IJ SOLN
INTRAMUSCULAR | Status: AC
  Filled 2014-11-06: qty 1

## 2014-11-06 MED ORDER — PROPOFOL 10 MG/ML IV BOLUS
INTRAVENOUS | Status: DC | PRN
  Administered 2014-11-06: 10 mg via INTRAVENOUS
  Administered 2014-11-06 (×2): 20 mg via INTRAVENOUS
  Administered 2014-11-06: 250 mg via INTRAVENOUS

## 2014-11-06 MED ORDER — METHOCARBAMOL 1000 MG/10ML IJ SOLN
500.0000 mg | Freq: Four times a day (QID) | INTRAMUSCULAR | Status: DC | PRN
  Administered 2014-11-06: 500 mg via INTRAVENOUS
  Filled 2014-11-06 (×2): qty 5

## 2014-11-06 MED ORDER — ONDANSETRON HCL 4 MG PO TABS
4.0000 mg | ORAL_TABLET | Freq: Four times a day (QID) | ORAL | Status: DC | PRN
  Administered 2014-11-06: 4 mg via ORAL
  Filled 2014-11-06: qty 1

## 2014-11-06 MED ORDER — VANCOMYCIN HCL IN DEXTROSE 1-5 GM/200ML-% IV SOLN
1000.0000 mg | Freq: Two times a day (BID) | INTRAVENOUS | Status: AC
  Administered 2014-11-06: 1000 mg via INTRAVENOUS
  Filled 2014-11-06: qty 200

## 2014-11-06 MED ORDER — PHENOL 1.4 % MT LIQD: 1.0000 | OROMUCOSAL | Status: DC | PRN

## 2014-11-06 MED ORDER — SODIUM CHLORIDE 0.9 % IV SOLN: INTRAVENOUS | Status: DC

## 2014-11-06 MED ORDER — ESCITALOPRAM OXALATE 10 MG PO TABS
10.0000 mg | ORAL_TABLET | Freq: Every day | ORAL | Status: DC
  Administered 2014-11-07 – 2014-11-08 (×2): 10 mg via ORAL
  Filled 2014-11-06 (×2): qty 1

## 2014-11-06 MED ORDER — BUPIVACAINE-EPINEPHRINE (PF) 0.5% -1:200000 IJ SOLN
INTRAMUSCULAR | Status: AC
  Filled 2014-11-06: qty 30

## 2014-11-06 MED ORDER — DIPHENHYDRAMINE HCL 12.5 MG/5ML PO ELIX
12.5000 mg | ORAL_SOLUTION | ORAL | Status: DC | PRN
Start: 2014-11-06 — End: 2014-11-08

## 2014-11-06 SURGICAL SUPPLY — 61 items
BANDAGE ELASTIC 4 VELCRO ST LF (GAUZE/BANDAGES/DRESSINGS) ×3 IMPLANT
BANDAGE ELASTIC 6 VELCRO ST LF (GAUZE/BANDAGES/DRESSINGS) ×3 IMPLANT
BANDAGE ESMARK 6X9 LF (GAUZE/BANDAGES/DRESSINGS) ×1 IMPLANT
BLADE SAGITTAL 13X1.27X60 (BLADE) ×2 IMPLANT
BLADE SAGITTAL 13X1.27X60MM (BLADE) ×1
BLADE SAW SGTL 83.5X18.5 (BLADE) ×3 IMPLANT
BLADE SURG 10 STRL SS (BLADE) ×3 IMPLANT
BNDG ESMARK 6X9 LF (GAUZE/BANDAGES/DRESSINGS) ×3
BOWL SMART MIX CTS (DISPOSABLE) ×3 IMPLANT
CAPT KNEE TOTAL 3 ×3 IMPLANT
CEMENT BONE SIMPLEX SPEEDSET (Cement) ×6 IMPLANT
COVER SURGICAL LIGHT HANDLE (MISCELLANEOUS) ×3 IMPLANT
CUFF TOURNIQUET SINGLE 34IN LL (TOURNIQUET CUFF) ×3 IMPLANT
DRAPE EXTREMITY T 121X128X90 (DRAPE) ×3 IMPLANT
DRAPE INCISE IOBAN 66X45 STRL (DRAPES) ×6 IMPLANT
DRAPE PROXIMA HALF (DRAPES) IMPLANT
DRAPE U-SHAPE 47X51 STRL (DRAPES) ×3 IMPLANT
DRSG ADAPTIC 3X8 NADH LF (GAUZE/BANDAGES/DRESSINGS) ×3 IMPLANT
DRSG PAD ABDOMINAL 8X10 ST (GAUZE/BANDAGES/DRESSINGS) ×3 IMPLANT
DURAPREP 26ML APPLICATOR (WOUND CARE) ×6 IMPLANT
ELECT REM PT RETURN 9FT ADLT (ELECTROSURGICAL) ×3
ELECTRODE REM PT RTRN 9FT ADLT (ELECTROSURGICAL) ×1 IMPLANT
GAUZE SPONGE 4X4 12PLY STRL (GAUZE/BANDAGES/DRESSINGS) ×3 IMPLANT
GLOVE BIOGEL M 7.0 STRL (GLOVE) ×6 IMPLANT
GLOVE BIOGEL PI IND STRL 7.5 (GLOVE) ×2 IMPLANT
GLOVE BIOGEL PI IND STRL 8.5 (GLOVE) ×1 IMPLANT
GLOVE BIOGEL PI INDICATOR 7.5 (GLOVE) ×4
GLOVE BIOGEL PI INDICATOR 8.5 (GLOVE) ×2
GLOVE SURG ORTHO 8.0 STRL STRW (GLOVE) ×3 IMPLANT
GOWN STRL REUS W/ TWL LRG LVL3 (GOWN DISPOSABLE) ×1 IMPLANT
GOWN STRL REUS W/ TWL XL LVL3 (GOWN DISPOSABLE) ×2 IMPLANT
GOWN STRL REUS W/TWL LRG LVL3 (GOWN DISPOSABLE) ×2
GOWN STRL REUS W/TWL XL LVL3 (GOWN DISPOSABLE) ×4
HANDPIECE INTERPULSE COAX TIP (DISPOSABLE) ×2
HOOD PEEL AWAY FACE SHEILD DIS (HOOD) ×9 IMPLANT
KIT BASIN OR (CUSTOM PROCEDURE TRAY) ×3 IMPLANT
KIT ROOM TURNOVER OR (KITS) ×3 IMPLANT
KNEE CAPITATED TOTAL 3 ×1 IMPLANT
MANIFOLD NEPTUNE II (INSTRUMENTS) ×3 IMPLANT
NEEDLE 22X1 1/2 (OR ONLY) (NEEDLE) ×6 IMPLANT
NS IRRIG 1000ML POUR BTL (IV SOLUTION) ×3 IMPLANT
PACK TOTAL JOINT (CUSTOM PROCEDURE TRAY) ×3 IMPLANT
PACK UNIVERSAL I (CUSTOM PROCEDURE TRAY) ×3 IMPLANT
PAD ARMBOARD 7.5X6 YLW CONV (MISCELLANEOUS) ×6 IMPLANT
PAD CAST 4YDX4 CTTN HI CHSV (CAST SUPPLIES) ×1 IMPLANT
PADDING CAST COTTON 4X4 STRL (CAST SUPPLIES) ×2
PADDING CAST COTTON 6X4 STRL (CAST SUPPLIES) ×3 IMPLANT
SET HNDPC FAN SPRY TIP SCT (DISPOSABLE) ×1 IMPLANT
STAPLER VISISTAT 35W (STAPLE) ×3 IMPLANT
SUCTION FRAZIER TIP 10 FR DISP (SUCTIONS) ×3 IMPLANT
SUT BONE WAX W31G (SUTURE) ×3 IMPLANT
SUT VIC AB 0 CTB1 27 (SUTURE) ×6 IMPLANT
SUT VIC AB 1 CT1 27 (SUTURE) ×4
SUT VIC AB 1 CT1 27XBRD ANBCTR (SUTURE) ×2 IMPLANT
SUT VIC AB 2-0 CT1 27 (SUTURE) ×4
SUT VIC AB 2-0 CT1 TAPERPNT 27 (SUTURE) ×2 IMPLANT
SYR 20CC LL (SYRINGE) ×6 IMPLANT
SYRINGE 60CC LL (MISCELLANEOUS) ×3 IMPLANT
TOWEL OR 17X24 6PK STRL BLUE (TOWEL DISPOSABLE) ×3 IMPLANT
TOWEL OR 17X26 10 PK STRL BLUE (TOWEL DISPOSABLE) ×3 IMPLANT
WATER STERILE IRR 1000ML POUR (IV SOLUTION) ×6 IMPLANT

## 2014-11-06 NOTE — Evaluation (Signed)
Physical Therapy Evaluation Patient Details Name: Shelley Underwood MRN: [id number] DOB: [birth year] Today's Date: 11/06/2014   History of Present Illness  Patient is a 64 y/o female s/p left TKA. PMH includes HTN, restless leg syndrome, hypothyroidism.  Clinical Impression  Patient presents with pain and post surgical deficits LLE s/p left TKA. Tolerated short distance ambulation with Min A for balance/safety. Pt lives alone but plans on going to stay with daughter at discharge. Instructed pt in exercises. Will follow acutely to maximize independence and mobility prior to return home.     Follow Up Recommendations Home health PT;Supervision/Assistance - 24 hour    Equipment Recommendations  None recommended by PT    Recommendations for Other Services OT consult     Precautions / Restrictions Precautions Precautions: Knee Precaution Booklet Issued: No Precaution Comments: Reviewed no pillow under knee and precautions. Restrictions Weight Bearing Restrictions: Yes LLE Weight Bearing: Weight bearing as tolerated      Mobility  Bed Mobility Overal bed mobility: Needs Assistance Bed Mobility: Supine to Sit     Supine to sit: Supervision;HOB elevated     General bed mobility comments: Supervision for safety. USe of rails for support.  Transfers Overall transfer level: Needs assistance Equipment used: Rolling walker (2 wheeled) Transfers: Sit to/from Stand Sit to Stand: Min guard         General transfer comment: Min guard for safety. Cues for hand placement.   Ambulation/Gait Ambulation/Gait assistance: Min assist Ambulation Distance (Feet): 6 Feet Assistive device: Rolling walker (2 wheeled) Gait Pattern/deviations: Step-to pattern;Decreased stride length;Decreased stance time - left;Decreased step length - right;Trunk flexed   Gait velocity interpretation: <1.8 ft/sec, indicative of risk for recurrent falls General Gait Details: Slow, mildly unsteady gait.  Increased pain.  Stairs            Wheelchair Mobility    Modified Rankin (Stroke Patients Only)       Balance Overall balance assessment: Needs assistance Sitting-balance support: Feet supported;No upper extremity supported Sitting balance-Leahy Scale: Good     Standing balance support: During functional activity Standing balance-Leahy Scale: Fair Standing balance comment: Able to donn underwear without UE support for short period.                             Pertinent Vitals/Pain Pain Assessment: 0-10 Pain Score: 7  Pain Location: left knee Pain Descriptors / Indicators: Sore;Aching Pain Intervention(s): Monitored during session;Repositioned;Patient requesting pain meds-RN notified;Limited activity within patient's tolerance;Ice applied    Home Living Family/patient expects to be discharged to:: Private residence Living Arrangements: Children Available Help at Discharge: Family;Available 24 hours/day Type of Home: House Home Access: Stairs to enter Entrance Stairs-Rails: None Entrance Stairs-Number of Steps: 2 small steps Home Layout: One level Home Equipment: Walker - 2 wheels;Bedside commode      Prior Function Level of Independence: Independent         Comments: Drives, cooks, cleans. Lives alone but will be staying with her daughter     Hand Dominance        Extremity/Trunk Assessment   Upper Extremity Assessment: Defer to OT evaluation           Lower Extremity Assessment: LLE deficits/detail   LLE Deficits / Details: Limited AROM/strength secondary to pain and surgery.     Communication   Communication: No difficulties  Cognition Arousal/Alertness: Awake/alert Behavior During Therapy: WFL for tasks assessed/performed Overall Cognitive Status: Within Functional Limits for tasks  assessed                      General Comments General comments (skin integrity, edema, etc.): Daughter present during session.     Exercises Total Joint Exercises Ankle Circles/Pumps: Both;10 reps;Seated Quad Sets: Both;10 reps;Seated Gluteal Sets: Both;10 reps;Seated      Assessment/Plan    PT Assessment Patient needs continued PT services  PT Diagnosis Difficulty walking;Acute pain   PT Problem List Decreased strength;Pain;Decreased range of motion;Decreased activity tolerance;Decreased balance;Decreased mobility  PT Treatment Interventions Balance training;Gait training;Functional mobility training;Therapeutic activities;Therapeutic exercise;Patient/family education;Stair training   PT Goals (Current goals can be found in the Care Plan section) Acute Rehab PT Goals Patient Stated Goal: to return to independence PT Goal Formulation: With patient Time For Goal Achievement: 11/20/14 Potential to Achieve Goals: Good    Frequency 7X/week   Barriers to discharge        Co-evaluation               End of Session Equipment Utilized During Treatment: Gait belt Activity Tolerance: Patient tolerated treatment well;Patient limited by pain Patient left: in chair;with call bell/phone within reach;with family/visitor present Nurse Communication: Mobility status         Time: 1610-9604 PT Time Calculation (min) (ACUTE ONLY): 37 min   Charges:   PT Evaluation $Initial PT Evaluation Tier I: 1 Procedure PT Treatments $Therapeutic Activity: 8-22 mins   PT G Codes:        Shelley Underwood 11/06/2014, 4:13 PM Mylo Red, PT, DPT [phone]

## 2014-11-06 NOTE — Anesthesia Procedure Notes (Signed)
Procedure Name: LMA Insertion Date/Time: 11/06/2014 9:21 AM Performed by: Margaree Mackintosh Pre-anesthesia Checklist: Patient identified, Emergency Drugs available, Suction available, Patient being monitored and Timeout performed Patient Re-evaluated:Patient Re-evaluated prior to inductionOxygen Delivery Method: Circle system utilized Preoxygenation: Pre-oxygenation with 100% oxygen Intubation Type: IV induction LMA: LMA inserted LMA Size: 4.0 Number of attempts: 1 Placement Confirmation: positive ETCO2 and breath sounds checked- equal and bilateral Tube secured with: Tape Dental Injury: Teeth and Oropharynx as per pre-operative assessment

## 2014-11-06 NOTE — Anesthesia Postprocedure Evaluation (Signed)
  Anesthesia Post-op Note  Patient: Shelley Underwood  Procedure(s) Performed: Procedure(s): LEFT TOTAL KNEE ARTHROPLASTY (Left)  Patient Location: PACU  Anesthesia Type:General  Level of Consciousness: awake, alert , oriented and patient cooperative  Airway and Oxygen Therapy: Patient Spontanous Breathing and Patient connected to nasal cannula oxygen  Post-op Pain: mild  Post-op Assessment: Post-op Vital signs reviewed, Patient's Cardiovascular Status Stable, Respiratory Function Stable, Patent Airway and Pain level controlled, nausea improving              Post-op Vital Signs: Reviewed and stable  Last Vitals:  Filed Vitals:   11/06/14 1125  BP: 130/88  Pulse: 91  Temp: 36.7 C  Resp: 17    Complications: No apparent anesthesia complications, (although unable to place SAB)

## 2014-11-06 NOTE — Progress Notes (Signed)
Pharmacy: Penicillin Allergy Clarification  Pharmacy asked to clarify and discuss severity of PCN allergy with Shelley Underwood. The patient stated that allergy was a "long time ago" and could not remember the name of the medication but stated that the rash was not severe - only mild. The patient is okay to receive the pre-op Cefazolin ordered today.  Georgina Pillion, PharmD, BCPS Clinical Pharmacist Pager: [phone] 11/06/2014 7:36 AM

## 2014-11-06 NOTE — Plan of Care (Signed)
Problem: Consults Goal: Diagnosis- Total Joint Replacement Outcome: Completed/Met Date Met:  11/06/14 Primary Total Knee Right

## 2014-11-06 NOTE — Care Management (Signed)
Utilization review completed. Simrin Vegh, RN Case Manager [phone]. 

## 2014-11-06 NOTE — Progress Notes (Signed)
Orthopedic Tech Progress Note Patient Details:  Shelley Underwood 05/04/1950 [id number]  CPM Left Knee CPM Left Knee: On Left Knee Flexion (Degrees): 90 Left Knee Extension (Degrees): 0 Additional Comments: Trapeze bar and foot roll   Saul Fordyce 11/06/2014, 12:15 PM

## 2014-11-06 NOTE — H&P (Signed)
  Shelley Underwood MRN:  [id number] DOB/SEX:  1950-09-07/female  CHIEF COMPLAINT:  Painful left Knee  HISTORY: Patient is a 64 y.o. female presented with a history of pain in the left knee. Onset of symptoms was gradual starting several years ago with rapidly worsening course since that time. Prior procedures on the knee include meniscectomy. Patient has been treated conservatively with over-the-counter NSAIDs and activity modification. Patient currently rates pain in the knee at 10 out of 10 with activity. There is pain at night.  PAST MEDICAL HISTORY: There are no active problems to display for this patient.  Past Medical History  Diagnosis Date  . Hypertension   . Restless leg syndrome   . Hypothyroidism   . Depression   . Arthritis    Past Surgical History  Procedure Laterality Date  . Abdominal hysterectomy    . Cholecystectomy    . Tonsillectomy    . Total knee arthroplasty Right 2002     MEDICATIONS:   No prescriptions prior to admission    ALLERGIES:   Allergies  Allergen Reactions  . Macrobid [Nitrofurantoin Monohyd Macro] Anaphylaxis  . Oxycodone Hcl Other (See Comments)    Other reaction(s): Other 'makes me crazy'  . Penicillins Rash    REVIEW OF SYSTEMS:  Pertinent items noted in HPI and remainder of comprehensive ROS otherwise negative.   FAMILY HISTORY:  No family history on file.  SOCIAL HISTORY:   Social History  Substance Use Topics  . Smoking status: Current Every Day Smoker -- 0.50 packs/day for 30 years    Types: Cigarettes  . Smokeless tobacco: Not on file  . Alcohol Use: No     EXAMINATION:  Vital signs in last 24 hours:    General appearance: alert, cooperative and no distress Lungs: clear to auscultation bilaterally Heart: regular rate and rhythm, S1, S2 normal, no murmur, click, rub or gallop Abdomen: soft, non-tender; bowel sounds normal; no masses,  no organomegaly Extremities: extremities normal, atraumatic, no cyanosis or  edema and Homans sign is negative, no sign of DVT Pulses: 2+ and symmetric Skin: Skin color, texture, turgor normal. No rashes or lesions Neurologic: Alert and oriented X 3, normal strength and tone. Normal symmetric reflexes. Normal coordination and gait  Musculoskeletal:  ROM 0-105, Ligaments intact,  Imaging Review Plain radiographs demonstrate severe degenerative joint disease of the left knee. The overall alignment is significant varus. The bone quality appears to be good for age and reported activity level.  Assessment/Plan: Primary osteoarthritis, left knee   The patient history, physical examination and imaging studies are consistent with advanced degenerative joint disease of the left knee. The patient has failed conservative treatment.  The clearance notes were reviewed.  After discussion with the patient it was felt that Total Knee Replacement was indicated. The procedure,  risks, and benefits of total knee arthroplasty were presented and reviewed. The risks including but not limited to aseptic loosening, infection, blood clots, vascular injury, stiffness, patella tracking problems complications among others were discussed. The patient acknowledged the explanation, agreed to proceed with the plan.  Teyon Odette 11/06/2014, 6:14 AM

## 2014-11-06 NOTE — Transfer of Care (Signed)
Immediate Anesthesia Transfer of Care Note  Patient: Shelley Underwood  Procedure(s) Performed: Procedure(s): LEFT TOTAL KNEE ARTHROPLASTY (Left)  Patient Location: PACU  Anesthesia Type:General  Level of Consciousness: awake, alert  and oriented  Airway & Oxygen Therapy: Patient Spontanous Breathing and Patient connected to nasal cannula oxygen  Post-op Assessment: Report given to RN and Post -op Vital signs reviewed and stable  Post vital signs: Reviewed and stable  Last Vitals:  Filed Vitals:   11/06/14 1125  BP: 130/88  Pulse: 91  Temp: 36.7 C  Resp: 17    Complications: No apparent anesthesia complications

## 2014-11-06 NOTE — Anesthesia Preprocedure Evaluation (Addendum)
Anesthesia Evaluation  Patient identified by MRN, date of birth, ID band Patient awake    Reviewed: Allergy & Precautions, NPO status , Patient's Chart, lab work & pertinent test results, reviewed documented beta blocker date and time   History of Anesthesia Complications Negative for: history of anesthetic complications  Airway Mallampati: II  TM Distance: >3 FB Neck ROM: Full    Dental  (+) Dental Advisory Given, Missing   Pulmonary COPD, Current Smoker,    breath sounds clear to auscultation       Cardiovascular hypertension, Pt. on medications and Pt. on home beta blockers (-) angina Rhythm:Regular Rate:Normal     Neuro/Psych Depression negative neurological ROS     GI/Hepatic negative GI ROS, Neg liver ROS,   Endo/Other  Hypothyroidism Morbid obesity  Renal/GU negative Renal ROS     Musculoskeletal  (+) Arthritis , Osteoarthritis,    Abdominal (+) + obese,   Peds  Hematology   Anesthesia Other Findings   Reproductive/Obstetrics                            Anesthesia Physical Anesthesia Plan  ASA: III  Anesthesia Plan: Spinal   Post-op Pain Management:    Induction:   Airway Management Planned: Natural Airway and Simple Face Mask  Additional Equipment:   Intra-op Plan:   Post-operative Plan:   Informed Consent: I have reviewed the patients History and Physical, chart, labs and discussed the procedure including the risks, benefits and alternatives for the proposed anesthesia with the patient or authorized representative who has indicated his/her understanding and acceptance.   Dental advisory given  Plan Discussed with: Surgeon and CRNA  Anesthesia Plan Comments: (Plan routine monitors, SAB)        Anesthesia Quick Evaluation

## 2014-11-07 ENCOUNTER — Encounter (HOSPITAL_COMMUNITY): Payer: Self-pay | Admitting: General Practice

## 2014-11-07 DIAGNOSIS — M1712 Unilateral primary osteoarthritis, left knee: Secondary | ICD-10-CM | POA: Diagnosis not present

## 2014-11-07 LAB — BASIC METABOLIC PANEL
Anion gap: 7 (ref 5–15)
BUN: 11 mg/dL (ref 6–20)
CALCIUM: 8.5 mg/dL — AB (ref 8.9–10.3)
CO2: 25 mmol/L (ref 22–32)
CREATININE: 0.95 mg/dL (ref 0.44–1.00)
Chloride: 103 mmol/L (ref 101–111)
GFR calc non Af Amer: >60 mL/min (ref >60)
GLUCOSE: 101 mg/dL — AB (ref 65–99)
Potassium: 4.1 mmol/L (ref 3.5–5.1)
Sodium: 135 mmol/L (ref 135–145)

## 2014-11-07 LAB — CBC
HCT: 32 % — ABNORMAL LOW (ref 36.0–46.0)
Hemoglobin: 10.5 g/dL — ABNORMAL LOW (ref 12.0–15.0)
MCH: 31.8 pg (ref 26.0–34.0)
MCHC: 32.8 g/dL (ref 30.0–36.0)
MCV: 97 fL (ref 78.0–100.0)
PLATELETS: 243 10*3/uL (ref 150–400)
RBC: 3.3 MIL/uL — ABNORMAL LOW (ref 3.87–5.11)
RDW: 14.5 % (ref 11.5–15.5)
WBC: 8.9 10*3/uL (ref 4.0–10.5)

## 2014-11-07 MED ORDER — HYDROCODONE-ACETAMINOPHEN 10-325 MG PO TABS: 1.0000 | ORAL_TABLET | ORAL | Status: DC | PRN

## 2014-11-07 MED ORDER — ENOXAPARIN SODIUM 40 MG/0.4ML ~~LOC~~ SOLN: 40.0000 mg | SUBCUTANEOUS | Status: DC

## 2014-11-07 MED ORDER — METHOCARBAMOL 500 MG PO TABS: 500.0000 mg | ORAL_TABLET | Freq: Four times a day (QID) | ORAL | Status: DC | PRN

## 2014-11-07 NOTE — Discharge Instructions (Signed)

## 2014-11-07 NOTE — Care Management Note (Signed)
Case Management Note  Patient Details  Name: Shelley Underwood MRN: [id number] Date of Birth: 10/17/1950  Subjective/Objective:  64 yr old female s/p left total knee arthroplasty.                  Action/Plan: Case manager spoke with patient and her daughter concerning home health and DME needs. Choice was offered. Referral was called to [facility], [facility]. Rolling walker, 3in1 and CPM have been delivered to patient's home. Patient's daughter- Shelley Underwood, states her mom will  Be staying with her to recover, [address] [place], [place] [address]. Patient's cell# is [phone], daughter's home # [phone].   Expected Discharge Date:    11/08/14              Expected Discharge Plan:   Home with Home Health  In-House Referral:  NA  Discharge planning Services  CM Consult  Post Acute Care Choice:  Home Health Choice offered to:  Patient  DME Arranged:  3-N-1, CPM, Walker rolling DME Agency:  [employer]  HH Arranged:  PT HH Agency:  [facility]  Status of Service:  Completed, signed off  Medicare Important Message Given:    Date Medicare IM Given:    Medicare IM give by:    Date Additional Medicare IM Given:    Additional Medicare Important Message give by:     If discussed at Long Length of Stay Meetings, dates discussed:    Additional Comments:  Durenda Guthrie, RN 11/07/2014, 1:03 PM

## 2014-11-07 NOTE — Progress Notes (Signed)
Physical Therapy Treatment Patient Details Name: Shelley Underwood MRN: [id number] DOB: Aug 16, 1950 Today's Date: 11/07/2014    History of Present Illness Patient is a 64 y.o. female s/p left TKA. PMH includes HTN, restless leg syndrome, hypothyroidism.    PT Comments    Pt is progressing towards goals. Pt was able to ambulate 80 feet.. Pt was instructed on HEP. Pt required vcs for gait deficits listed below. Pt will benefit from continued skilled acute physical therapy care to improve mobility and decrease functional deficits for eventual discharge to home with family assist and HHPT.   Follow Up Recommendations  Home health PT;Supervision/Assistance - 24 hour     Equipment Recommendations  None recommended by PT    Recommendations for Other Services       Precautions / Restrictions Precautions Precautions: Knee;Fall Precaution Comments: educated on knee precautions Restrictions Weight Bearing Restrictions: Yes LLE Weight Bearing: Weight bearing as tolerated    Mobility  Bed Mobility Overal bed mobility: Needs Assistance Bed Mobility: Supine to Sit;Sit to Supine     Supine to sit: Min assist Sit to supine: Min assist   General bed mobility comments: assist for left leg   Transfers Overall transfer level: Needs assistance Equipment used: Rolling walker (2 wheeled) Transfers: Sit to/from Stand Sit to Stand: Min assist;Min guard         General transfer comment: increased time and cues for hand placement, assist for balance/safety  Ambulation/Gait Ambulation/Gait assistance: Supervision Ambulation Distance (Feet): 80 Feet Assistive device: Rolling walker (2 wheeled) Gait Pattern/deviations: Step-to pattern;Antalgic;Decreased stance time - left;Trunk flexed;Wide base of support;Decreased step length - right     General Gait Details: Pt was cued to initiate weight shifting prior to start of ambulation. Pt required vcs for heel contact, stride length, and  posture during ambulation. Pt required vcs for sequencing to negotiate uneven threshold in bathroom.   Stairs            Wheelchair Mobility    Modified Rankin (Stroke Patients Only)       Balance Overall balance assessment: Needs assistance Sitting-balance support: No upper extremity supported Sitting balance-Leahy Scale: Good     Standing balance support: Bilateral upper extremity supported Standing balance-Leahy Scale: Poor Standing balance comment: Pt is apprehensive to approximate weight through LLE                    Cognition Arousal/Alertness: Awake/alert Behavior During Therapy: WFL for tasks assessed/performed Overall Cognitive Status: Within Functional Limits for tasks assessed                      Exercises Total Joint Exercises Ankle Circles/Pumps: Both;10 reps;AROM;Supine Quad Sets: AROM;Left;5 reps;Supine Short Arc Quad: AROM;5 reps;Left;Supine Heel Slides: AROM;Left;5 reps;Supine Hip ABduction/ADduction: AROM;5 reps;Supine;Left Straight Leg Raises: AROM;AAROM;5 reps;Left;Supine     General Comments        Pertinent Vitals/Pain Pain Assessment: Faces Pain Score: 10-Worst pain ever Faces Pain Scale: Hurts even more Pain Location: L knee Pain Descriptors / Indicators: Sore;Aching Pain Intervention(s): Monitored during session;Limited activity within patient's tolerance    Home Living                      Prior Function            PT Goals (current goals can now be found in the care plan section) Acute Rehab PT Goals Potential to Achieve Goals: Good Progress towards PT goals: Progressing toward goals  Frequency  7X/week    PT Plan Current plan remains appropriate    Co-evaluation             End of Session Equipment Utilized During Treatment: Gait belt Activity Tolerance: Patient limited by pain Patient left: in bed;with call bell/phone within reach;with family/visitor present     Time:  1520-1550 PT Time Calculation (min) (ACUTE ONLY): 30 min  Charges:  $Gait Training: 8-22 mins $Therapeutic Exercise: 8-22 mins                    G Codes:      WYNN,CYNDI 28-Nov-2014, 4:04 PM  Sheran Lawless, PT [phone] 11-28-14 Note made in collaboration with Garner Nash, SPT

## 2014-11-07 NOTE — Progress Notes (Signed)
SPORTS MEDICINE AND JOINT REPLACEMENT  Georgena Spurling, MD   Altamese Cabal, PA-C [address] [place], [place], [place]  [address]                             [phone]   PROGRESS NOTE  Subjective:  negative for Chest Pain  negative for Shortness of Breath  negative for Nausea/Vomiting   negative for Calf Pain  negative for Bowel Movement   Tolerating Diet: yes         Patient reports pain as 8 on 0-10 scale.    Objective: Vital signs in last 24 hours:   Patient Vitals for the past 24 hrs:  BP Temp Temp src Pulse Resp SpO2  11/07/14 1300 112/63 mmHg 98.6 F (37 C) Oral 75 18 93 %  11/07/14 0918 (!) 123/56 mmHg - - 83 - -  11/07/14 0533 (!) 120/54 mmHg 98 F (36.7 C) - 79 18 98 %  11/07/14 0216 118/60 mmHg 98.8 F (37.1 C) - 83 18 96 %  11/06/14 2031 (!) 118/50 mmHg 98.6 F (37 C) - 85 16 97 %  11/06/14 1743 (!) 113/58 mmHg 99 F (37.2 C) Oral 89 - 95 %    {1959:LAST@   Intake/Output from previous day:   10/03 0701 - 10/04 0700 In: 900 [I.V.:900] Out: 30    Intake/Output this shift:   10/04 0701 - 10/04 1900 In: 1430 [P.O.:480; I.V.:950] Out: -    Intake/Output      10/03 0701 - 10/04 0700 10/04 0701 - 10/05 0700   P.O.  480   I.V. (mL/kg) 900 (8) 950 (8.5)   Total Intake(mL/kg) 900 (8) 1430 (12.7)   Urine (mL/kg/hr) 0 (0)    Blood 30 (0)    Total Output 30     Net +870 +1430        Urine Occurrence 2 x       LABORATORY DATA:  Recent Labs  11/06/14 1610 11/07/14 0525  WBC 9.3 8.9  HGB 11.4* 10.5*  HCT 35.8* 32.0*  PLT 271 243    Recent Labs  11/06/14 1610 11/07/14 0525  NA  --  135  K  --  4.1  CL  --  103  CO2  --  25  BUN  --  11  CREATININE 0.89 0.95  GLUCOSE  --  101*  CALCIUM  --  8.5*   Lab Results  Component Value Date   INR 0.99 10/27/2014   INR 1.10 10/05/2014    Examination:  General appearance: alert, cooperative and no distress Extremities: Homans sign is negative, no sign of DVT  Wound Exam: clean, dry,  intact   Drainage:  Scant/small amount Serosanguinous exudate  Motor Exam: EHL and FHL Intact  Sensory Exam: Deep Peroneal normal   Assessment:    1 Day Post-Op  Procedure(s) (LRB): LEFT TOTAL KNEE ARTHROPLASTY (Left)  ADDITIONAL DIAGNOSIS:  Active Problems:   S/P total knee arthroplasty  Acute Blood Loss Anemia   Plan: Physical Therapy as ordered Weight Bearing as Tolerated (WBAT)  DVT Prophylaxis:  Lovenox  DISCHARGE PLAN: Home  DISCHARGE NEEDS: HHPT, CPM, Walker and 3-in-1 comode seat         Shelley Underwood 11/07/2014, 1:40 PM

## 2014-11-07 NOTE — Progress Notes (Signed)
33Physical Therapy Treatment Patient Details Name: Shelley Underwood MRN: [id number] DOB: 1950-12-26 Today's Date: 11/07/2014    History of Present Illness Patient is a 64 y.o. female s/p left TKA. PMH includes HTN, restless leg syndrome, hypothyroidism.    PT Comments    Patient unable to progress to ambulation outside the room this morning due to 10/10 pain in knee.  Feel she needs to stay inpatient one more day to be able to progress ambulation as well as steps for home entry. Will see again this pm and progress as tolerated.  Follow Up Recommendations  Home health PT;Supervision/Assistance - 24 hour     Equipment Recommendations  None recommended by PT    Recommendations for Other Services       Precautions / Restrictions Precautions Precautions: Knee;Fall Precaution Booklet Issued: No Precaution Comments: educated on knee precautions Restrictions Weight Bearing Restrictions: Yes LLE Weight Bearing: Weight bearing as tolerated    Mobility  Bed Mobility Overal bed mobility: Needs Assistance Bed Mobility: Supine to Sit     Supine to sit: Min assist;HOB elevated     General bed mobility comments: assist for left leg   Transfers Overall transfer level: Needs assistance Equipment used: Rolling walker (2 wheeled) Transfers: Sit to/from Stand Sit to Stand: Min assist         General transfer comment: increased time and cues for hand placement, assist for balance/safety  Ambulation/Gait Ambulation/Gait assistance: Min guard Ambulation Distance (Feet): 12 Feet Assistive device: Rolling walker (2 wheeled) Gait Pattern/deviations: Step-to pattern;Decreased step length - right;Decreased stance time - left;Trunk flexed;Antalgic     General Gait Details: painful with weight left LE so limited gait to around bed with walker.  patient tearful throughout and reports medication given previous to this session   Stairs            Wheelchair Mobility     Modified Rankin (Stroke Patients Only)       Balance Overall balance assessment: Needs assistance         Standing balance support: Bilateral upper extremity supported Standing balance-Leahy Scale: Poor Standing balance comment: able to stand with hand support on walker and most of weight on right LE                    Cognition Arousal/Alertness: Awake/alert Behavior During Therapy: WFL for tasks assessed/performed Overall Cognitive Status: Within Functional Limits for tasks assessed                      Exercises Total Joint Exercises Ankle Circles/Pumps: Both;10 reps;Supine Quad Sets: 10 reps;5 reps;Supine Heel Slides: AAROM;Left;5 reps;Supine Hip ABduction/ADduction: AROM;Left;10 reps;Supine Straight Leg Raises: AAROM;10 reps;Left;Supine Goniometric ROM: approx -12-46 in bed with ace wraop and severel ==    General Comments        Pertinent Vitals/Pain Pain Assessment: 0-10 Pain Score: 10-Worst pain ever Pain Location: L knee Pain Descriptors / Indicators: Sore;Aching Pain Intervention(s): Monitored during session;Premedicated before session;Ice applied;Limited activity within patient's tolerance    Home Living Family/patient expects to be discharged to:: Private residence Living Arrangements: Alone Available Help at Discharge: Family Type of Home: House Home Access: Stairs to enter Entrance Stairs-Rails: None Home Layout: One level Home Equipment: Environmental consultant - 2 wheels;Bedside commode      Prior Function Level of Independence: Independent      Comments: Drives, cooks, cleans. Lives alone but will be staying with her daughter   PT Goals (current goals can now be  found in the care plan section) Acute Rehab PT Goals Patient Stated Goal: not stated Progress towards PT goals: Progressing toward goals (slowed by pain)    Frequency  7X/week    PT Plan Current plan remains appropriate    Co-evaluation             End of Session  Equipment Utilized During Treatment: Gait belt Activity Tolerance: Patient limited by pain Patient left: in bed;with call bell/phone within reach     Time: 1120-1145 PT Time Calculation (min) (ACUTE ONLY): 25 min  Charges:  $Gait Training: 8-22 mins $Therapeutic Exercise: 8-22 mins                    G Codes:      Aseneth Hack,CYNDI 11-28-14, 12:20 PM  Sheran Lawless, PT [phone] Nov 28, 2014

## 2014-11-07 NOTE — Op Note (Signed)
TOTAL KNEE REPLACEMENT OPERATIVE NOTE:  11/06/2014  1:08 PM  PATIENT:  Shelley Underwood  64 y.o. female  PRE-OPERATIVE DIAGNOSIS:  PRIMARY OSTEOARTHRITIS LEFT KNEE  POST-OPERATIVE DIAGNOSIS:  PRIMARY OSTEOARTHRITIS LEFT KNEE  PROCEDURE:  Procedure(s): LEFT TOTAL KNEE ARTHROPLASTY  SURGEON:  Surgeon(s): Dannielle Huh, MD  PHYSICIAN ASSISTANT: Altamese Cabal, [facility]  ANESTHESIA:   general  DRAINS: Hemovac  SPECIMEN: None  COUNTS:  Correct  TOURNIQUET:   Total Tourniquet Time Documented: Thigh (Left) - 57 minutes Total: Thigh (Left) - 57 minutes   DICTATION:  Indication for procedure:    The patient is a 64 y.o. female who has failed conservative treatment for PRIMARY OSTEOARTHRITIS LEFT KNEE.  Informed consent was obtained prior to anesthesia. The risks versus benefits of the operation were explain and in a way the patient can, and did, understand.   On the implant demand matching protocol, this patient scored 8.  Therefore, this patient did" "did not receive a polyethylene insert with vitamin E which is a high demand implant.  Description of procedure:     The patient was taken to the operating room and placed under anesthesia.  The patient was positioned in the usual fashion taking care that all body parts were adequately padded and/or protected.  I foley catheter was not placed.  A tourniquet was applied and the leg prepped and draped in the usual sterile fashion.  The extremity was exsanguinated with the esmarch and tourniquet inflated to 350 mmHg.  Pre-operative range of motion was normal.  The knee was in 5 degree of mild varus.  A midline incision approximately 6-7 inches long was made with a #10 blade.  A new blade was used to make a parapatellar arthrotomy going 2-3 cm into the quadriceps tendon, over the patella, and alongside the medial aspect of the patellar tendon.  A synovectomy was then performed with the #10 blade and forceps. I then elevated the deep MCL off  the medial tibial metaphysis subperiosteally around to the semimembranosus attachment.    I everted the patella and used calipers to measure patellar thickness.  I used the reamer to ream down to appropriate thickness to recreate the native thickness.  I then removed excess bone with the rongeur and sagittal saw.  I used the appropriately sized template and drilled the three lug holes.  I then put the trial in place and measured the thickness with the calipers to ensure recreation of the native thickness.  The trial was then removed and the patella subluxed and the knee brought into flexion.  A homan retractor was place to retract and protect the patella and lateral structures.  A Z-retractor was place medially to protect the medial structures.  The extra-medullary alignment system was used to make cut the tibial articular surface perpendicular to the anamotic axis of the tibia and in 3 degrees of posterior slope.  The cut surface and alignment jig was removed.  I then used the intramedullary alignment guide to make a 6 valgus cut on the distal femur.  I then marked out the epicondylar axis on the distal femur.  The posterior condylar axis measured 3 degrees.  I then used the anterior referencing sizer and measured the femur to be a size 9.  The 4-In-1 cutting block was screwed into place in external rotation matching the posterior condylar angle, making our cuts perpendicular to the epicondylar axis.  Anterior, posterior and chamfer cuts were made with the sagittal saw.  The cutting block and cut  pieces were removed.  A lamina spreader was placed in 90 degrees of flexion.  The ACL, PCL, menisci, and posterior condylar osteophytes were removed.  A 11 mm spacer blocked was found to offer good flexion and extension gap balance after mild in degree releasing.   The scoop retractor was then placed and the femoral finishing block was pinned in place.  The small sagittal saw was used as well as the lug drill to  finish the femur.  The block and cut surfaces were removed and the medullary canal hole filled with autograft bone from the cut pieces.  The tibia was delivered forward in deep flexion and external rotation.  A size E tray was selected and pinned into place centered on the medial 1/3 of the tibial tubercle.  The reamer and keel was used to prepare the tibia through the tray.    I then trialed with the size 9 femur, size E tibia, a 11 mm insert and the 35 patella.  I had excellent flexion/extension gap balance, excellent patella tracking.  Flexion was full and beyond 120 degrees; extension was zero.  These components were chosen and the staff opened them to me on the back table while the knee was lavaged copiously and the cement mixed.  The soft tissue was infiltrated with 60cc of exparel 1.3% through a 21 gauge needle.  I cemented in the components and removed all excess cement.  The polyethylene tibial component was snapped into place and the knee placed in extension while cement was hardening.  The capsule was infilltrated with 30cc of .25% Marcaine with epinephrine.  A hemovac was place in the joint exiting superolaterally.  A pain pump was place superomedially superficial to the arthrotomy.  Once the cement was hard, the tourniquet was let down.  Hemostasis was obtained.  The arthrotomy was closed with figure-8 #1 vicryl sutures.  The deep soft tissues were closed with #0 vicryls and the subcuticular layer closed with a running #2-0 vicryl.  The skin was reapproximated and closed with skin staples.  The wound was dressed with xeroform, 4 x4's, 2 ABD sponges, a single layer of webril and a TED stocking.   The patient was then awakened, extubated, and taken to the recovery room in stable condition.  BLOOD LOSS:  300cc DRAINS: 1 hemovac, 1 pain catheter COMPLICATIONS:  None.  PLAN OF CARE: Admit to inpatient   PATIENT DISPOSITION:  PACU - hemodynamically stable.   Delay start of Pharmacological  VTE agent (>24hrs) due to surgical blood loss or risk of bleeding:  not applicable  Please fax a copy of this op note to my office at [phone] (please only include page 1 and 2 of the Case Information op note)

## 2014-11-07 NOTE — Evaluation (Signed)
Occupational Therapy Evaluation Patient Details Name: Shelley Underwood MRN: [id number] DOB: 03/17/1950 Today's Date: 11/07/2014    History of Present Illness Patient is a 64 y.o. female s/p left TKA. PMH includes HTN, restless leg syndrome, hypothyroidism.   Clinical Impression   Pt s/p above. Pt independent with ADLs, PTA. Feel pt will benefit from acute OT to increase independence prior to d/c.     Follow Up Recommendations  No OT follow up;Supervision - Intermittent    Equipment Recommendations  Other (comment) (AE)    Recommendations for Other Services       Precautions / Restrictions Precautions Precautions: Knee;Fall Precaution Booklet Issued: No Precaution Comments: educated on knee precautions Restrictions Weight Bearing Restrictions: Yes LLE Weight Bearing: Weight bearing as tolerated      Mobility Bed Mobility Overal bed mobility: Needs Assistance Bed Mobility: Supine to Sit     Supine to sit: Supervision;HOB elevated        Transfers Overall transfer level: Needs assistance Equipment used: Rolling walker (2 wheeled) Transfers: Sit to/from Stand Sit to Stand: Min assist         General transfer comment: assist to boost from bed. Cues given.    Balance       No LOB with ambulation-used RW. Balance not formally assessed.                                       ADL Overall ADL's : Needs assistance/impaired Eating/Feeding: Independent;Sitting                   Lower Body Dressing: Maximal assistance;Sit to/from stand   Toilet Transfer: Min guard;Minimal assistance;Ambulation;RW (assist to stand/boost from bed)           Functional mobility during ADLs: Min guard;Rolling walker General ADL Comments: Educated on LB dressing technique. Educated on AE. Explained benefit of reaching down to left foot as it allows knee to bend.  Educated on tub transfer techniques and options for shower chair.     Vision      Perception     Praxis      Pertinent Vitals/Pain Pain Assessment: 0-10 Pain Score: 7  Pain Location: left knee Pain Descriptors / Indicators: Throbbing;Other (Comment) (pulling) Pain Intervention(s): Repositioned;Monitored during session;Limited activity within patient's tolerance     Hand Dominance     Extremity/Trunk Assessment Upper Extremity Assessment Upper Extremity Assessment: Overall WFL for tasks assessed   Lower Extremity Assessment Lower Extremity Assessment: Defer to PT evaluation       Communication Communication Communication: No difficulties   Cognition Arousal/Alertness: Awake/alert Behavior During Therapy: WFL for tasks assessed/performed Overall Cognitive Status: Within Functional Limits for tasks assessed                     General Comments       Exercises       Shoulder Instructions      Home Living Family/patient expects to be discharged to:: Private residence Living Arrangements:  (plans to stay with daughter) Available Help at Discharge: Family Type of Home: House Home Access: Stairs to enter Secretary/administrator of Steps: 2 small steps Entrance Stairs-Rails: None Home Layout: One level     Bathroom Shower/Tub: Tub/shower unit         Home Equipment: Environmental consultant - 2 wheels;Bedside commode          Prior Functioning/Environment Level of Independence:  Independent        Comments: Drives, cooks, cleans. Lives alone but will be staying with her daughter    OT Diagnosis: Acute pain   OT Problem List: Decreased range of motion;Decreased knowledge of use of DME or AE;Decreased knowledge of precautions;Pain;Decreased activity tolerance   OT Treatment/Interventions: Self-care/ADL training;DME and/or AE instruction;Therapeutic activities;Patient/family education;Balance training    OT Goals(Current goals can be found in the care plan section) Acute Rehab OT Goals Patient Stated Goal: not stated OT Goal Formulation: With  patient Time For Goal Achievement: 11/14/14 Potential to Achieve Goals: Good ADL Goals Pt Will Perform Lower Body Dressing: with set-up;with supervision;sit to/from stand (with or without AE) Pt Will Transfer to Toilet: with supervision;ambulating (3 in 1 over commode) Pt Will Perform Tub/Shower Transfer: ambulating;Tub transfer;with supervision;rolling walker (shower seat vs 3 in 1)  OT Frequency: Min 2X/week   Barriers to D/C:            Co-evaluation              End of Session Equipment Utilized During Treatment: Gait belt;Rolling walker CPM Left Knee CPM Left Knee: Off  Activity Tolerance: Patient limited by pain Patient left: in chair;with call bell/phone within reach   Time: 1003-1020 OT Time Calculation (min): 17 min Charges:  OT General Charges $OT Visit: 1 Procedure OT Evaluation $Initial OT Evaluation Tier I: 1 Procedure G-CodesEarlie Raveling OTR/L [phone] 11/07/2014, 10:33 AM

## 2014-11-08 LAB — CBC
HCT: 31.4 % — ABNORMAL LOW (ref 36.0–46.0)
HEMOGLOBIN: 10.3 g/dL — AB (ref 12.0–15.0)
MCH: 31.7 pg (ref 26.0–34.0)
MCHC: 32.8 g/dL (ref 30.0–36.0)
MCV: 96.6 fL (ref 78.0–100.0)
PLATELETS: 228 10*3/uL (ref 150–400)
RBC: 3.25 MIL/uL — AB (ref 3.87–5.11)
RDW: 14.5 % (ref 11.5–15.5)
WBC: 7.4 10*3/uL (ref 4.0–10.5)

## 2014-11-08 NOTE — Progress Notes (Signed)
Pt ready for d/c per MD. Cleared by PT/OT, pain better controlled today. Equipment has been delivered. Discharge teaching given to pt and daughter at bedside, all questions answered. Wheeled out by [employer].  Shelley Underwood  11/08/2014

## 2014-11-08 NOTE — Progress Notes (Signed)
Physical Therapy Treatment Patient Details Name: Shelley Underwood MRN: [id number] DOB: 14-Mar-1950 Today's Date: 11/08/2014    History of Present Illness Patient is a 64 y.o. female s/p left TKA. PMH includes HTN, restless leg syndrome, hypothyroidism.    PT Comments    Pt did much better today and with decreased pain.  Able to improve quad set with strengthening and able to manage step with RW.  From a mobility standpoint pt is ok to dc home with family and HHPT.  Follow Up Recommendations  Home health PT;Supervision/Assistance - 24 hour     Equipment Recommendations  None recommended by PT    Recommendations for Other Services       Precautions / Restrictions Precautions Precautions: Knee;Fall Precaution Comments: educated on knee precautions Restrictions LLE Weight Bearing: Weight bearing as tolerated    Mobility  Bed Mobility               General bed mobility comments: up in recliner upon arrival  Transfers Overall transfer level: Needs assistance Equipment used: Rolling walker (2 wheeled) Transfers: Sit to/from Stand Sit to Stand: Min guard         General transfer comment: cues for hand placement multiple times during session  Ambulation/Gait Ambulation/Gait assistance: Supervision;Min guard Ambulation Distance (Feet): 70 Feet (x 2) Assistive device: Rolling walker (2 wheeled) Gait Pattern/deviations: Step-to pattern;Decreased stance time - left;Decreased step length - right;Trunk flexed     General Gait Details: cues for step length and posture.   Stairs Stairs: Yes Stairs assistance: Supervision Stair Management: With walker;Backwards;Forwards Number of Stairs: 1 General stair comments: Practiced going up 1 step backwards and down forwards. Pt has 2 steps but are divided by long sidewalk in between.  Wheelchair Mobility    Modified Rankin (Stroke Patients Only)       Balance Overall balance assessment: Needs assistance    Sitting balance-Leahy Scale: Good     Standing balance support: Bilateral upper extremity supported Standing balance-Leahy Scale: Poor                      Cognition Arousal/Alertness: Awake/alert Behavior During Therapy: WFL for tasks assessed/performed Overall Cognitive Status: Within Functional Limits for tasks assessed                      Exercises Total Joint Exercises Ankle Circles/Pumps: Both;10 reps;AROM;Supine Quad Sets: Strengthening;Left;Supine Heel Slides: AROM;Left;10 reps;Supine Hip ABduction/ADduction: AROM;Left;Supine Straight Leg Raises: AAROM;Left;5 reps;Supine Goniometric ROM: -5 to ~50 degrees with bulky dressing    General Comments        Pertinent Vitals/Pain Pain Assessment: 0-10 Pain Score: 5  Pain Location: L knee Pain Intervention(s): Premedicated before session;Monitored during session;Ice applied    Home Living                      Prior Function            PT Goals (current goals can now be found in the care plan section) Acute Rehab PT Goals Patient Stated Goal: not stated PT Goal Formulation: With patient Time For Goal Achievement: 11/20/14 Potential to Achieve Goals: Good Progress towards PT goals: Progressing toward goals    Frequency  7X/week    PT Plan Current plan remains appropriate    Co-evaluation             End of Session Equipment Utilized During Treatment: Gait belt Activity Tolerance: Patient tolerated treatment well Patient left: in  chair;with call bell/phone within reach     Time: 0838-0912 PT Time Calculation (min) (ACUTE ONLY): 34 min  Charges:  $Gait Training: 8-22 mins $Therapeutic Exercise: 8-22 mins                    G Codes:      Shelley Underwood 11/08/2014, 10:05 AM

## 2014-11-08 NOTE — Progress Notes (Signed)
Physician was called about the celecoxib and he said not to prescribe.

## 2014-11-28 NOTE — Discharge Summary (Signed)
SPORTS MEDICINE & JOINT REPLACEMENT   Georgena Spurling, MD   Altamese Cabal, PA-C [address] [place], [place], [place]  [address]                             [phone]  PATIENT ID: Shelley Underwood        MRN:  [id number]          DOB/AGE: 06-Nov-1950 / 64 y.o.    DISCHARGE SUMMARY  ADMISSION DATE:    11/06/2014 DISCHARGE DATE:   11/08/2014  ADMISSION DIAGNOSIS: PRIMARY OSTEOARTHRITIS LEFT KNEE    DISCHARGE DIAGNOSIS:  PRIMARY OSTEOARTHRITIS LEFT KNEE    ADDITIONAL DIAGNOSIS: Active Problems:   S/P total knee arthroplasty  Past Medical History  Diagnosis Date  . Hypertension   . Restless leg syndrome   . Hypothyroidism   . Depression   . Arthritis     PROCEDURE: Procedure(s): LEFT TOTAL KNEE ARTHROPLASTY on 11/06/2014  CONSULTS:     HISTORY:  See H&P in chart  HOSPITAL COURSE:  Shelley Underwood is a 64 y.o. admitted on 11/06/2014 and found to have a diagnosis of PRIMARY OSTEOARTHRITIS LEFT KNEE.  After appropriate laboratory studies were obtained  they were taken to the operating room on 11/06/2014 and underwent Procedure(s): LEFT TOTAL KNEE ARTHROPLASTY.   They were given perioperative antibiotics:  Anti-infectives    Start     Dose/Rate Route Frequency Ordered Stop   11/06/14 2100  vancomycin (VANCOCIN) IVPB 1000 mg/200 mL premix     1,000 mg 200 mL/hr over 60 Minutes Intravenous Every 12 hours 11/06/14 1348 11/06/14 2107   11/06/14 0735  ceFAZolin (ANCEF) IVPB 2 g/50 mL premix     2 g 100 mL/hr over 30 Minutes Intravenous On call to O.R. 11/06/14 0736 11/06/14 0923   11/06/14 0712  ceFAZolin (ANCEF) 2-3 GM-% IVPB SOLR    Comments:  Shireen Quan   : cabinet override      11/06/14 0712 11/06/14 1929    .  Tolerated the procedure well.  Placed with a foley intraoperatively.  Given Ofirmev at induction and for 48 hours.    POD# 1: Vital signs were stable.  Patient denied Chest pain, shortness of breath, or calf pain.  Patient was started on Lovenox 30 mg  subcutaneously twice daily at 8am.  Consults to PT, OT, and care management were made.  The patient was weight bearing as tolerated.  CPM was placed on the operative leg 0-90 degrees for 6-8 hours a day.  Incentive spirometry was taught.  Dressing was changed.  Hemovac was discontinued.      POD #2, Continued  PT for ambulation and exercise program.  IV saline locked.  O2 discontinued.    The remainder of the hospital course was dedicated to ambulation and strengthening.   The patient was discharged on 22 Days Post-Op in  Good condition.  Blood products given:none  DIAGNOSTIC STUDIES: Recent vital signs: No data found.      Recent laboratory studies: No results for input(s): WBC, HGB, HCT, PLT in the last 168 hours. No results for input(s): NA, K, CL, CO2, BUN, CREATININE, GLUCOSE, CALCIUM in the last 168 hours. Lab Results  Component Value Date   INR 0.99 10/27/2014   INR 1.10 10/05/2014     Recent Radiographic Studies :  No results found.  DISCHARGE INSTRUCTIONS: Discharge Instructions    CPM    Complete by:  As directed  Continuous passive motion machine (CPM):      Use the CPM from 0 to 90 for 6-8 hours per day.      You may increase by 10 per day.  You may break it up into 2 or 3 sessions per day.      Use CPM for 2 weeks or until you are told to stop.     Call MD / Call 911    Complete by:  As directed   If you experience chest pain or shortness of breath, CALL 911 and be transported to the hospital emergency room.  If you develope a fever above 101 F, pus (white drainage) or increased drainage or redness at the wound, or calf pain, call your surgeon's office.     Change dressing    Complete by:  As directed   Change dressing on Thursday, then change the dressing daily with sterile 4 x 4 inch gauze dressing and apply TED hose.     Constipation Prevention    Complete by:  As directed   Drink plenty of fluids.  Prune juice may be helpful.  You may use a stool softener,  such as Colace (over the counter) 100 mg twice a day.  Use MiraLax (over the counter) for constipation as needed.     Diet - low sodium heart healthy    Complete by:  As directed      Do not put a pillow under the knee. Place it under the heel.    Complete by:  As directed      Driving restrictions    Complete by:  As directed   No driving for 6 weeks     Increase activity slowly as tolerated    Complete by:  As directed      Lifting restrictions    Complete by:  As directed   No lifting for 6 weeks     TED hose    Complete by:  As directed   Use stockings (TED hose) for 2 weeks on both leg(s).  You may remove them at night for sleeping.           DISCHARGE MEDICATIONS:     Medication List    STOP taking these medications        aspirin EC 81 MG tablet      TAKE these medications        atorvastatin 20 MG tablet  Commonly known as:  LIPITOR  Take 20 mg by mouth daily.     buPROPion 150 MG 24 hr tablet  Commonly known as:  WELLBUTRIN XL  Take 150 mg by mouth daily.     enoxaparin 40 MG/0.4ML injection  Commonly known as:  LOVENOX  Inject 0.4 mLs (40 mg total) into the skin daily.     escitalopram 10 MG tablet  Commonly known as:  LEXAPRO  Take 10 mg by mouth daily.     HYDROcodone-acetaminophen 10-325 MG tablet  Commonly known as:  NORCO  Take 1-2 tablets by mouth every 4 (four) hours as needed (breakthrough pain).     levothyroxine 112 MCG tablet  Commonly known as:  SYNTHROID, LEVOTHROID  Take 112 mcg by mouth daily before breakfast.     methocarbamol 500 MG tablet  Commonly known as:  ROBAXIN  Take 1-2 tablets (500-1,000 mg total) by mouth every 6 (six) hours as needed for muscle spasms.     metoprolol 100 MG tablet  Commonly known as:  LOPRESSOR  Take  100 mg by mouth daily.     omega-3 acid ethyl esters 1 G capsule  Commonly known as:  LOVAZA  Take 1 g by mouth daily.     pramipexole 0.5 MG tablet  Commonly known as:  MIRAPEX  Take 0.5 mg by  mouth at bedtime.     triamterene-hydrochlorothiazide 37.5-25 MG tablet  Commonly known as:  MAXZIDE-25  Take 1 tablet by mouth daily.        FOLLOW UP VISIT:       Follow-up Information    Follow up with [facility].   Why:  Someone from [facility] will contact you concerning start date and time for therapy.   Contact information:   [address] [place] [address] [phone]       Follow up with Raymon MuttonLUCEY,STEPHEN D, MD. Call on 11/21/2014.   Specialty:  Orthopedic Surgery   Contact information:   [address] [place] [address] [phone]       DISPOSITION: HOME   CONDITION:  Good   Shelley Underwood 11/28/2014, 1:32 PM

## 2015-06-22 DIAGNOSIS — R072 Precordial pain: Secondary | ICD-10-CM | POA: Diagnosis not present

## 2015-09-03 DIAGNOSIS — Z79899 Other long term (current) drug therapy: Secondary | ICD-10-CM | POA: Diagnosis not present

## 2015-09-03 DIAGNOSIS — E039 Hypothyroidism, unspecified: Secondary | ICD-10-CM | POA: Diagnosis not present

## 2015-09-03 DIAGNOSIS — G43001 Migraine without aura, not intractable, with status migrainosus: Secondary | ICD-10-CM | POA: Diagnosis not present

## 2015-09-03 DIAGNOSIS — E785 Hyperlipidemia, unspecified: Secondary | ICD-10-CM | POA: Diagnosis not present

## 2015-09-21 DIAGNOSIS — G43119 Migraine with aura, intractable, without status migrainosus: Secondary | ICD-10-CM | POA: Diagnosis not present

## 2015-09-21 DIAGNOSIS — R05 Cough: Secondary | ICD-10-CM | POA: Diagnosis not present

## 2015-10-11 ENCOUNTER — Encounter: Payer: Self-pay | Admitting: Vascular Surgery

## 2015-10-17 ENCOUNTER — Encounter: Payer: Self-pay | Admitting: Vascular Surgery

## 2015-10-17 ENCOUNTER — Ambulatory Visit (INDEPENDENT_AMBULATORY_CARE_PROVIDER_SITE_OTHER): Payer: Medicare Other | Admitting: Vascular Surgery

## 2015-10-17 VITALS — BP 130/71 | HR 61 | Ht 67.0 in | Wt 248.0 lb

## 2015-10-17 DIAGNOSIS — I6523 Occlusion and stenosis of bilateral carotid arteries: Secondary | ICD-10-CM | POA: Diagnosis not present

## 2015-10-17 NOTE — Progress Notes (Signed)
Patient name: Shelley Underwood MRN: [id number] DOB: Dec 23, 1950 Sex: female  REASON FOR CONSULT: Evaluation for carotid occlusive disease  HPI: Shelley Underwood is a 65 y.o. female, here today for a discussion of recent diagnosis of significant carotid occlusive disease. She is a very pleasant 65 year old female smoker. She underwent CT scan of her chest for evaluation of chest pain to rule out pulmonary embolus. This was in May 2017. The study was negative for PE. She did have an incidental finding of occlusion of her left common carotid artery. This artery had been patent on a prior CT angiogram in July 2014. She subsequently underwent carotid duplex for further evaluation of this and I have discussed report for evaluation as well. This was on May 2017 as well. This revealed occlusion of her common carotid artery on the left there was flow in her internal and external carotid on the left. She did have moderate bifurcation plaque on the right and the 50-69% range. The patient is right handed. Portions she denies any prior history of amaurosis fugax, transient ischemic attack or stroke. She has no history of cardiac disease. She does have some lower from the arthritis but no claudication or other peripheral vascular occlusive disease symptoms  Past Medical History:  Diagnosis Date  . Arthritis   . Depression   . Hypertension   . Hypothyroidism   . Restless leg syndrome     History reviewed. No pertinent family history.  SOCIAL HISTORY: Social History   Social History  . Marital status: Married    Spouse name: N/A  . Number of children: N/A  . Years of education: N/A   Occupational History  . Not on file.   Social History Main Topics  . Smoking status: Current Every Day Smoker    Packs/day: 0.50    Years: 30.00    Types: Cigarettes  . Smokeless tobacco: Never Used  . Alcohol use No  . Drug use: No  . Sexual activity: Not on file   Other Topics Concern  . Not on file    Social History Narrative  . No narrative on file    Allergies  Allergen Reactions  . Macrobid [Nitrofurantoin Monohyd Macro] Anaphylaxis  . Oxycodone Hcl Other (See Comments)    Other reaction(s): Other 'makes me crazy'  . Penicillins Rash    Current Outpatient Prescriptions  Medication Sig Dispense Refill  . atorvastatin (LIPITOR) 20 MG tablet Take 20 mg by mouth daily.    Marland Kitchen buPROPion (WELLBUTRIN XL) 150 MG 24 hr tablet Take 150 mg by mouth daily.    Marland Kitchen escitalopram (LEXAPRO) 10 MG tablet Take 10 mg by mouth daily.    Marland Kitchen HYDROcodone-acetaminophen (NORCO) 10-325 MG tablet Take 1-2 tablets by mouth every 4 (four) hours as needed (breakthrough pain). 90 tablet 0  . levothyroxine (SYNTHROID, LEVOTHROID) 112 MCG tablet Take 125 mcg by mouth daily before breakfast.     . methocarbamol (ROBAXIN) 500 MG tablet Take 1-2 tablets (500-1,000 mg total) by mouth every 6 (six) hours as needed for muscle spasms. 60 tablet 0  . metoprolol (LOPRESSOR) 100 MG tablet Take 100 mg by mouth daily.    Marland Kitchen omega-3 acid ethyl esters (LOVAZA) 1 G capsule Take 1 g by mouth daily.    . pramipexole (MIRAPEX) 0.5 MG tablet Take 0.5 mg by mouth at bedtime.    . triamterene-hydrochlorothiazide (MAXZIDE-25) 37.5-25 MG per tablet Take 1 tablet by mouth daily.    Marland Kitchen enoxaparin (LOVENOX) 40 MG/0.4ML  injection Inject 0.4 mLs (40 mg total) into the skin daily. (Patient not taking: Reported on 10/17/2015) 12 Syringe 0   No current facility-administered medications for this visit.     REVIEW OF SYSTEMS:  [X]  denotes positive finding, [ ]  denotes negative finding Cardiac  Comments:  Chest pain or chest pressure: x   Shortness of breath upon exertion:    Short of breath when lying flat:    Irregular heart rhythm:        Vascular    Pain in calf, thigh, or hip brought on by ambulation:    Pain in feet at night that wakes you up from your sleep:     Blood clot in your veins:    Leg swelling:         Pulmonary     Oxygen at home:    Productive cough:     Wheezing:         Neurologic    Sudden weakness in arms or legs:     Sudden numbness in arms or legs:     Sudden onset of difficulty speaking or slurred speech:    Temporary loss of vision in one eye:     Problems with dizziness:         Gastrointestinal    Blood in stool:     Vomited blood:         Genitourinary    Burning when urinating:     Blood in urine:        Psychiatric    Major depression:         Hematologic    Bleeding problems:    Problems with blood clotting too easily:        Skin    Rashes or ulcers:        Constitutional    Fever or chills:      PHYSICAL EXAM: Vitals:   10/17/15 1356 10/17/15 1408  BP: 126/68 130/71  Pulse: 61   SpO2: 97%   Weight: 248 lb (112.5 kg)   Height: 5\' 7"  (1.702 m)     GENERAL: The patient is a well-nourished female, in no acute distress. The vital signs are documented above. CARDIAC: There is a regular rate and rhythm.  VASCULAR: Carotid arteries without bruits bilaterally. 2+ radial and 2+ dorsalis pedis pulses bilaterally PULMONARY: There is good air exchange bilaterally without wheezing or rales. ABDOMEN: Soft and non-tender with normal pitched bowel sounds.  MUSCULOSKELETAL: There are no major deformities or cyanosis. NEUROLOGIC: No focal weakness or paresthesias are detected. SKIN: There are no ulcers or rashes noted. PSYCHIATRIC: The patient has a normal affect.  DATA:   Reviewed her duplex. Results as noted above with occlusion of her left common carotid artery and moderate right bifurcation stenosis  MEDICAL ISSUES:  Long discussion with the patient regarding the significance of her left common carotid artery occlusion. Explained that she had demonstrated adequate collateral flow in that this was a stable situation treatment possible and also no treatment needed since she had demonstrated adequate [place]. I did explain symptoms of carotid disease with her and  she will present to the emergency room immediately should this occur. I have recommended continued surveillance of her moderate right carotid stenosis which is contralateral to the left carotid occlusion. We will see her again in 6 months with repeat carotid duplex for ongoing follow-up.  I did discuss the critical importance of smoking cessation. Explained that this was by far and away her most  significant risk factor. She understands and will see [place] again in 6 months for continued follow-up   Aya Geisel [facility] [employer] [employer] [facility] [phone]

## 2015-10-18 ENCOUNTER — Encounter: Payer: Self-pay | Admitting: Internal Medicine

## 2015-10-24 DIAGNOSIS — R600 Localized edema: Secondary | ICD-10-CM | POA: Diagnosis not present

## 2015-10-24 DIAGNOSIS — R0789 Other chest pain: Secondary | ICD-10-CM | POA: Diagnosis not present

## 2015-10-24 DIAGNOSIS — R079 Chest pain, unspecified: Secondary | ICD-10-CM | POA: Diagnosis not present

## 2015-10-24 DIAGNOSIS — I10 Essential (primary) hypertension: Secondary | ICD-10-CM | POA: Diagnosis not present

## 2015-10-24 DIAGNOSIS — J449 Chronic obstructive pulmonary disease, unspecified: Secondary | ICD-10-CM | POA: Diagnosis not present

## 2015-10-24 DIAGNOSIS — R609 Edema, unspecified: Secondary | ICD-10-CM | POA: Diagnosis not present

## 2015-10-24 DIAGNOSIS — E7800 Pure hypercholesterolemia, unspecified: Secondary | ICD-10-CM | POA: Diagnosis not present

## 2015-11-06 DIAGNOSIS — Z23 Encounter for immunization: Secondary | ICD-10-CM | POA: Diagnosis not present

## 2015-11-06 DIAGNOSIS — Z1389 Encounter for screening for other disorder: Secondary | ICD-10-CM | POA: Diagnosis not present

## 2015-11-06 DIAGNOSIS — R609 Edema, unspecified: Secondary | ICD-10-CM | POA: Diagnosis not present

## 2015-11-06 DIAGNOSIS — Z13820 Encounter for screening for osteoporosis: Secondary | ICD-10-CM | POA: Diagnosis not present

## 2015-11-06 DIAGNOSIS — M8580 Other specified disorders of bone density and structure, unspecified site: Secondary | ICD-10-CM | POA: Diagnosis not present

## 2015-11-28 DIAGNOSIS — Z9181 History of falling: Secondary | ICD-10-CM | POA: Diagnosis not present

## 2015-11-28 DIAGNOSIS — M7071 Other bursitis of hip, right hip: Secondary | ICD-10-CM | POA: Diagnosis not present

## 2015-12-10 DIAGNOSIS — M7061 Trochanteric bursitis, right hip: Secondary | ICD-10-CM | POA: Diagnosis not present

## 2015-12-17 DIAGNOSIS — M7061 Trochanteric bursitis, right hip: Secondary | ICD-10-CM | POA: Diagnosis not present

## 2016-01-09 NOTE — Addendum Note (Signed)
Addended by: Burton ApleyPETTY, Jaelon Gatley A on: 01/09/2016 12:19 PM   Modules accepted: Orders

## 2016-02-18 DIAGNOSIS — J329 Chronic sinusitis, unspecified: Secondary | ICD-10-CM | POA: Diagnosis not present

## 2016-02-25 DIAGNOSIS — G4483 Primary cough headache: Secondary | ICD-10-CM | POA: Diagnosis not present

## 2016-03-20 DIAGNOSIS — M8580 Other specified disorders of bone density and structure, unspecified site: Secondary | ICD-10-CM | POA: Diagnosis not present

## 2016-03-20 DIAGNOSIS — E039 Hypothyroidism, unspecified: Secondary | ICD-10-CM | POA: Diagnosis not present

## 2016-03-20 DIAGNOSIS — Z13820 Encounter for screening for osteoporosis: Secondary | ICD-10-CM | POA: Diagnosis not present

## 2016-03-20 DIAGNOSIS — Z0000 Encounter for general adult medical examination without abnormal findings: Secondary | ICD-10-CM | POA: Diagnosis not present

## 2016-03-20 DIAGNOSIS — G43001 Migraine without aura, not intractable, with status migrainosus: Secondary | ICD-10-CM | POA: Diagnosis not present

## 2016-03-20 DIAGNOSIS — Z79899 Other long term (current) drug therapy: Secondary | ICD-10-CM | POA: Diagnosis not present

## 2016-03-20 DIAGNOSIS — I10 Essential (primary) hypertension: Secondary | ICD-10-CM | POA: Diagnosis not present

## 2016-04-11 ENCOUNTER — Encounter: Payer: Self-pay | Admitting: Vascular Surgery

## 2016-04-22 ENCOUNTER — Ambulatory Visit: Payer: Medicare Other | Admitting: Vascular Surgery

## 2016-04-22 ENCOUNTER — Encounter (HOSPITAL_COMMUNITY): Payer: Medicare Other

## 2016-05-06 DIAGNOSIS — G43119 Migraine with aura, intractable, without status migrainosus: Secondary | ICD-10-CM | POA: Diagnosis not present

## 2016-05-08 DIAGNOSIS — G43119 Migraine with aura, intractable, without status migrainosus: Secondary | ICD-10-CM | POA: Diagnosis not present

## 2016-05-29 ENCOUNTER — Encounter: Payer: Self-pay | Admitting: Vascular Surgery

## 2016-06-10 ENCOUNTER — Ambulatory Visit (HOSPITAL_COMMUNITY): Payer: Medicare Other | Attending: Vascular Surgery

## 2016-06-10 ENCOUNTER — Ambulatory Visit: Payer: Medicare Other | Admitting: Vascular Surgery

## 2016-06-24 DIAGNOSIS — M7551 Bursitis of right shoulder: Secondary | ICD-10-CM | POA: Diagnosis not present

## 2016-07-16 IMAGING — CR DG CHEST 2V
2 series · 2 of 2 positions shown · non-contrast
Comparison: Portable chest x-ray August 18, 2012 and PA and
lateral chest x-ray January 23, 2009.

CLINICAL DATA: Preoperative exam prior left knee joint replacement

EXAM:
CHEST  2 VIEW

[w chest pa]
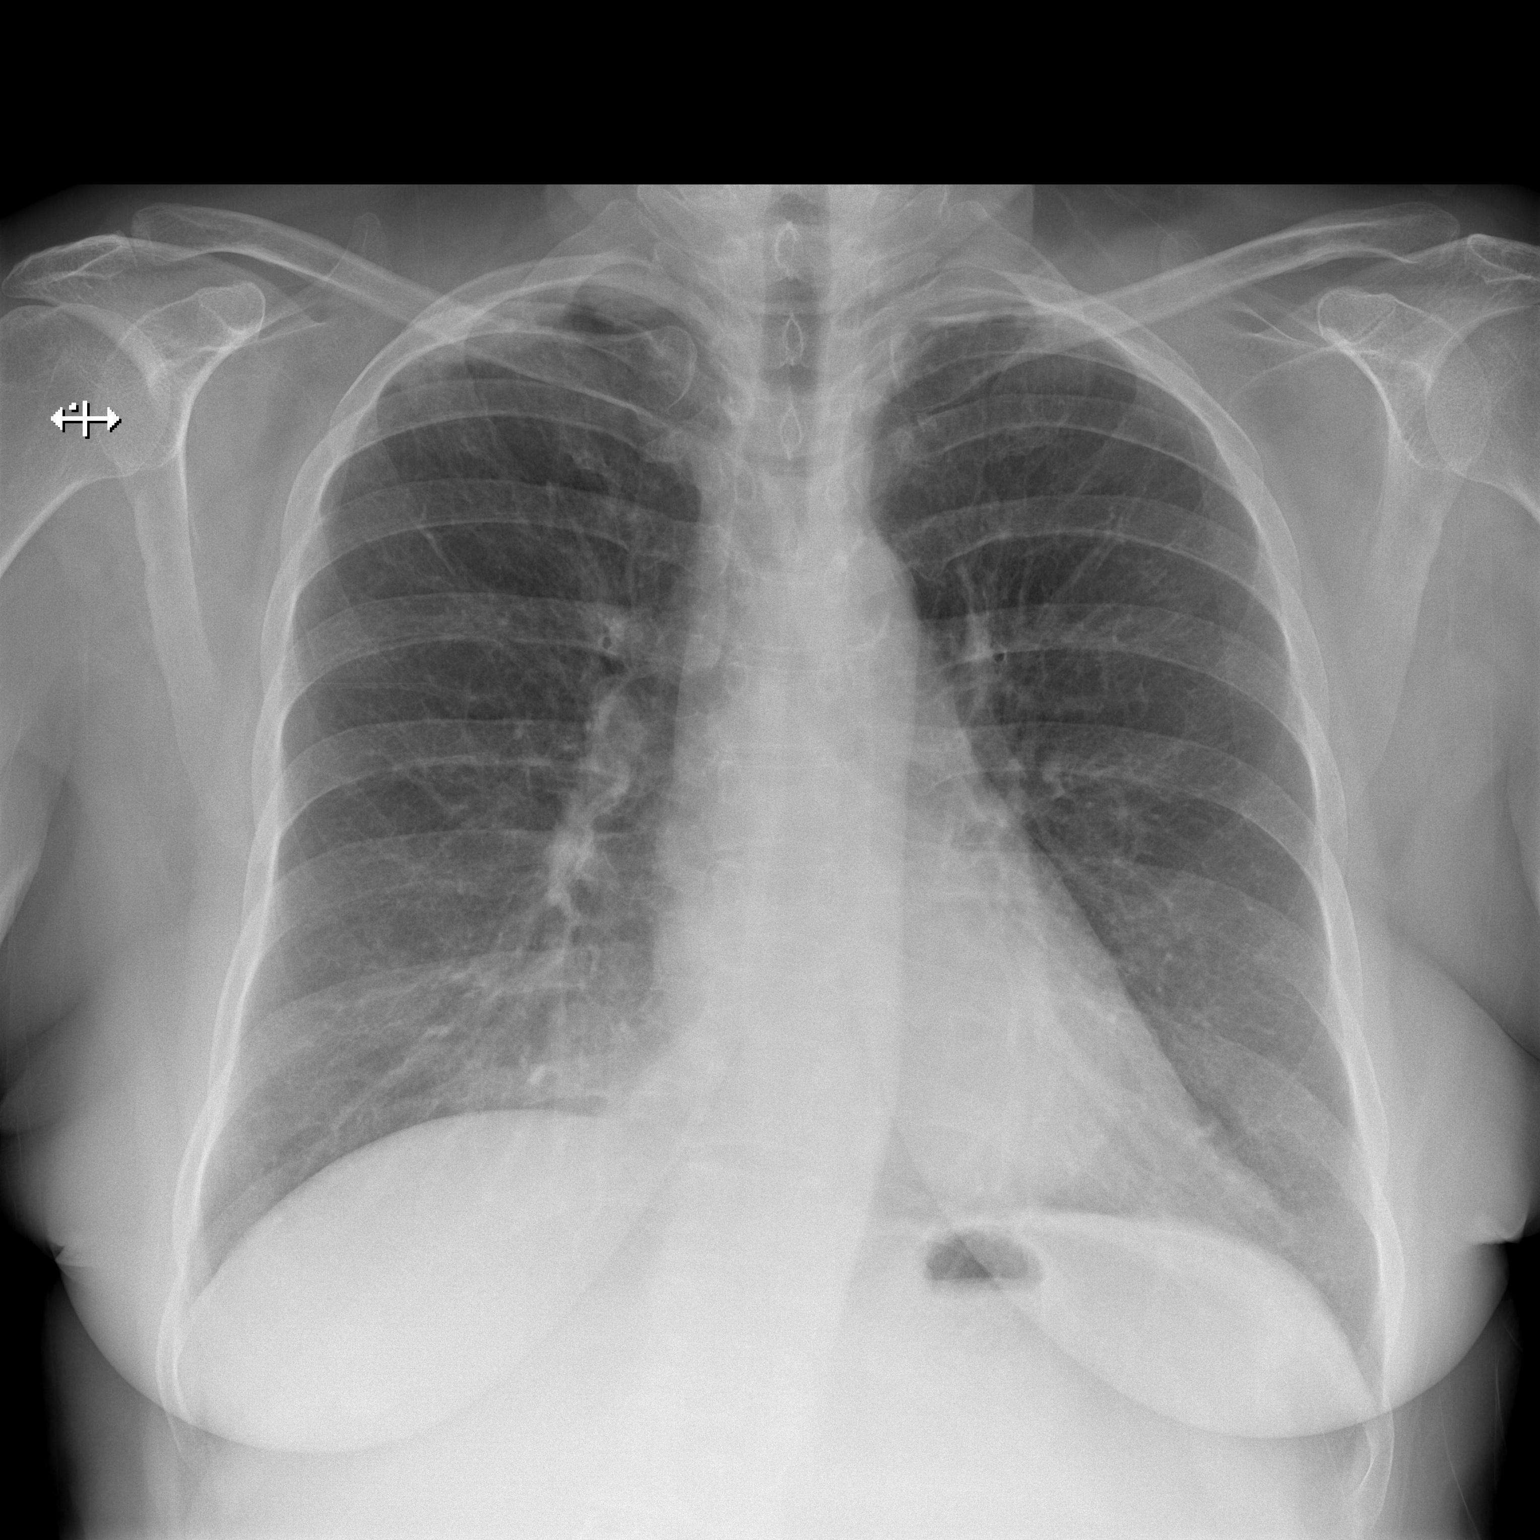

[w chest lat]
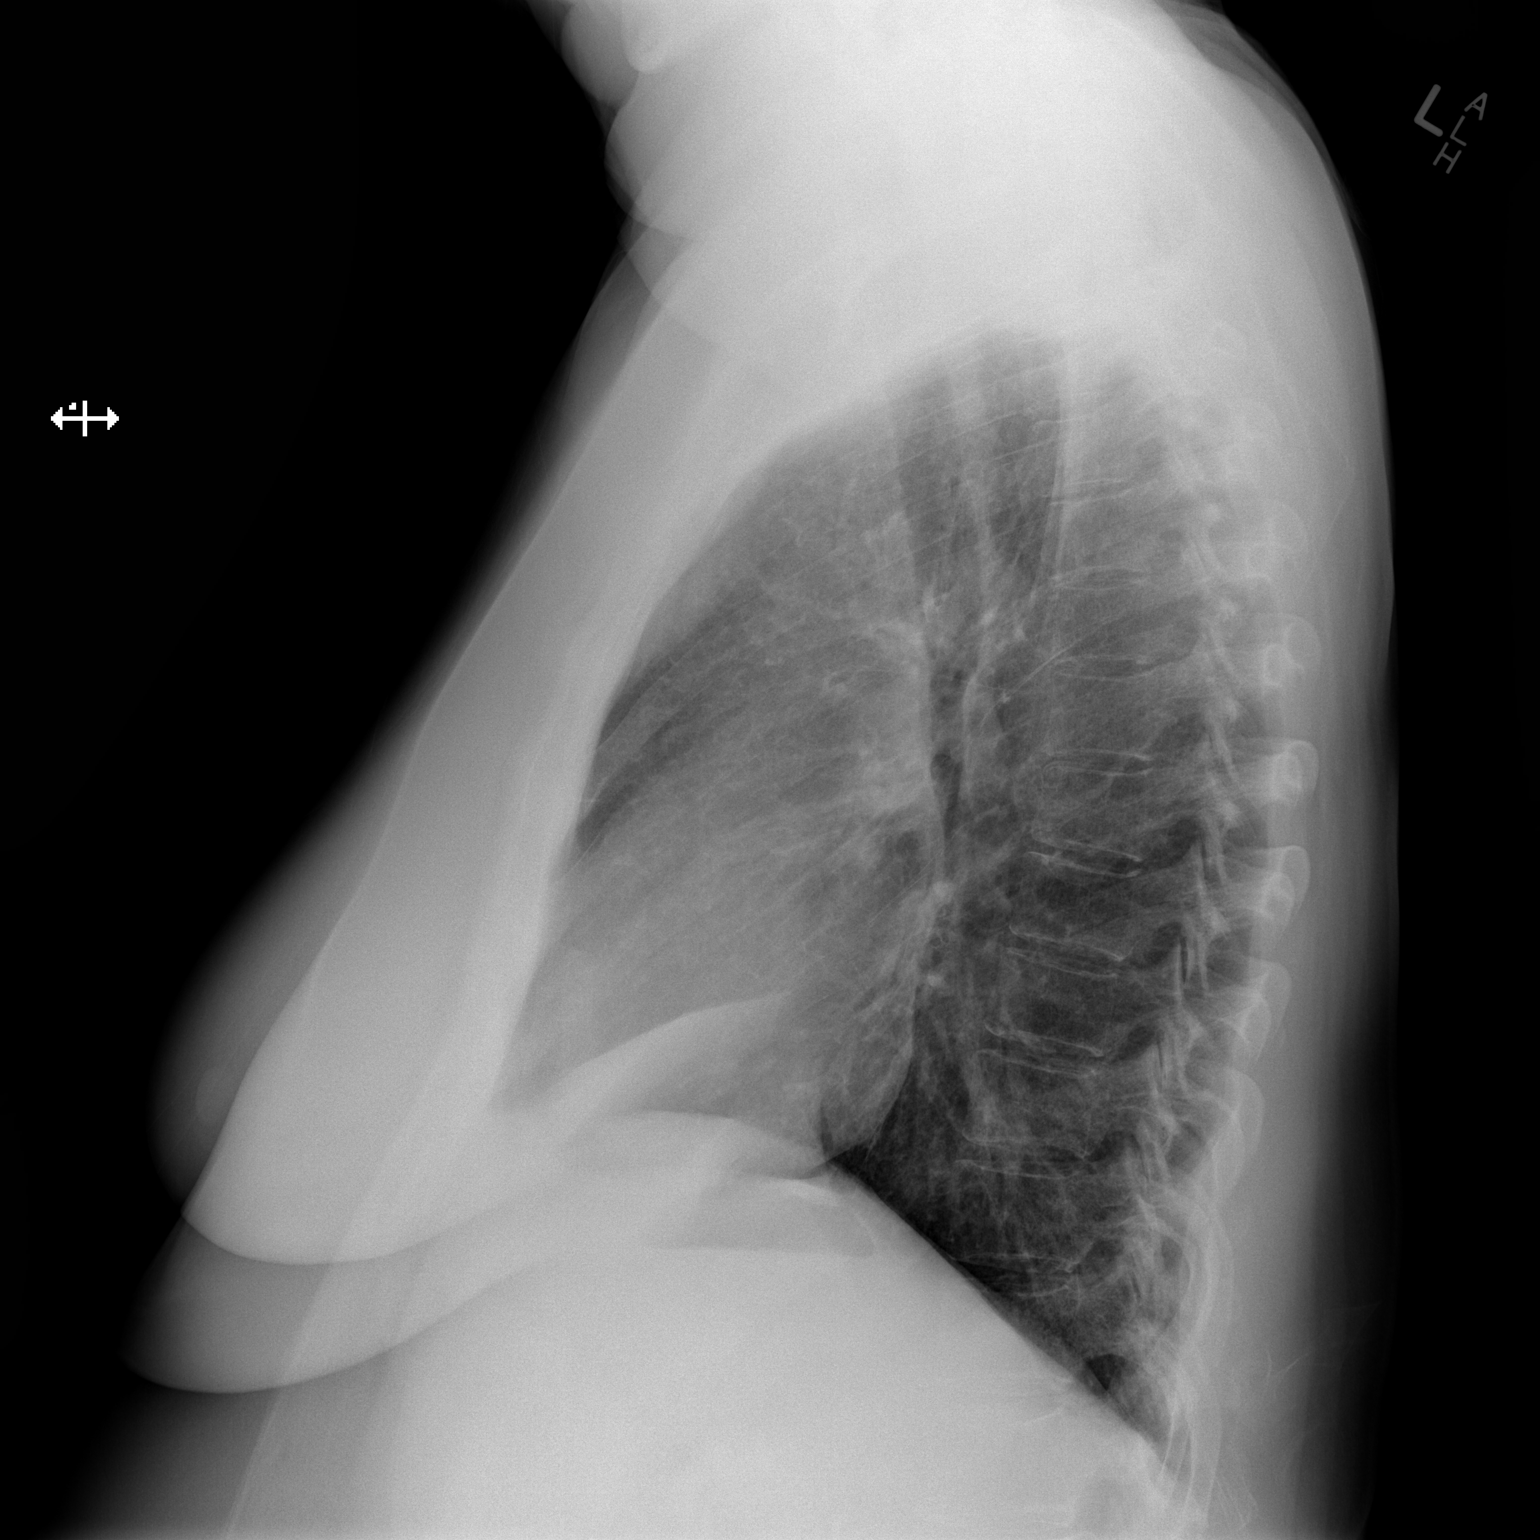

[2 of 2 positions shown; findings below may reference images not displayed]

FINDINGS: The lungs are adequately inflated. There is no focal infiltrate.
There is no pleural effusion. The heart and pulmonary vascularity
are normal. The mediastinum is normal in width. There is stable
mildly increased density in the medial and anterior aspects of the
minor fissure. The bony thorax exhibits no acute abnormality.
IMPRESSION: There is no active cardiopulmonary disease.

## 2016-09-10 DIAGNOSIS — E785 Hyperlipidemia, unspecified: Secondary | ICD-10-CM | POA: Diagnosis not present

## 2016-09-10 DIAGNOSIS — E039 Hypothyroidism, unspecified: Secondary | ICD-10-CM | POA: Diagnosis not present

## 2016-09-10 DIAGNOSIS — Z79899 Other long term (current) drug therapy: Secondary | ICD-10-CM | POA: Diagnosis not present

## 2016-11-12 DIAGNOSIS — B349 Viral infection, unspecified: Secondary | ICD-10-CM | POA: Diagnosis not present

## 2016-11-24 DIAGNOSIS — Z23 Encounter for immunization: Secondary | ICD-10-CM | POA: Diagnosis not present

## 2017-03-11 DIAGNOSIS — Z79899 Other long term (current) drug therapy: Secondary | ICD-10-CM | POA: Diagnosis not present

## 2017-03-11 DIAGNOSIS — Z0000 Encounter for general adult medical examination without abnormal findings: Secondary | ICD-10-CM | POA: Diagnosis not present

## 2017-03-11 DIAGNOSIS — E039 Hypothyroidism, unspecified: Secondary | ICD-10-CM | POA: Diagnosis not present

## 2017-03-11 DIAGNOSIS — I10 Essential (primary) hypertension: Secondary | ICD-10-CM | POA: Diagnosis not present

## 2017-07-15 DIAGNOSIS — R51 Headache: Secondary | ICD-10-CM | POA: Diagnosis not present

## 2017-07-15 DIAGNOSIS — R5381 Other malaise: Secondary | ICD-10-CM | POA: Diagnosis not present

## 2017-07-15 DIAGNOSIS — I10 Essential (primary) hypertension: Secondary | ICD-10-CM | POA: Diagnosis not present

## 2017-07-17 DIAGNOSIS — Z5321 Procedure and treatment not carried out due to patient leaving prior to being seen by health care provider: Secondary | ICD-10-CM | POA: Diagnosis not present

## 2017-07-17 DIAGNOSIS — I160 Hypertensive urgency: Secondary | ICD-10-CM | POA: Diagnosis not present

## 2017-07-17 DIAGNOSIS — R200 Anesthesia of skin: Secondary | ICD-10-CM | POA: Diagnosis not present

## 2017-07-17 DIAGNOSIS — R030 Elevated blood-pressure reading, without diagnosis of hypertension: Secondary | ICD-10-CM | POA: Diagnosis not present

## 2017-07-20 DIAGNOSIS — I10 Essential (primary) hypertension: Secondary | ICD-10-CM | POA: Diagnosis not present

## 2017-08-17 DIAGNOSIS — I10 Essential (primary) hypertension: Secondary | ICD-10-CM | POA: Diagnosis not present

## 2017-08-17 DIAGNOSIS — E039 Hypothyroidism, unspecified: Secondary | ICD-10-CM | POA: Diagnosis not present

## 2017-08-17 DIAGNOSIS — E785 Hyperlipidemia, unspecified: Secondary | ICD-10-CM | POA: Diagnosis not present

## 2017-08-17 DIAGNOSIS — Z79899 Other long term (current) drug therapy: Secondary | ICD-10-CM | POA: Diagnosis not present

## 2017-08-17 DIAGNOSIS — G43001 Migraine without aura, not intractable, with status migrainosus: Secondary | ICD-10-CM | POA: Diagnosis not present

## 2017-11-05 DIAGNOSIS — H25813 Combined forms of age-related cataract, bilateral: Secondary | ICD-10-CM | POA: Diagnosis not present

## 2017-11-09 DIAGNOSIS — Z23 Encounter for immunization: Secondary | ICD-10-CM | POA: Diagnosis not present

## 2017-11-10 DIAGNOSIS — Z01818 Encounter for other preprocedural examination: Secondary | ICD-10-CM | POA: Diagnosis not present

## 2017-11-10 DIAGNOSIS — Q120 Congenital cataract: Secondary | ICD-10-CM | POA: Diagnosis not present

## 2017-11-10 DIAGNOSIS — H02831 Dermatochalasis of right upper eyelid: Secondary | ICD-10-CM | POA: Diagnosis not present

## 2017-12-25 DIAGNOSIS — H2511 Age-related nuclear cataract, right eye: Secondary | ICD-10-CM | POA: Diagnosis not present

## 2017-12-25 DIAGNOSIS — H25811 Combined forms of age-related cataract, right eye: Secondary | ICD-10-CM | POA: Diagnosis not present

## 2017-12-25 DIAGNOSIS — Q120 Congenital cataract: Secondary | ICD-10-CM | POA: Diagnosis not present

## 2018-01-19 DIAGNOSIS — Z961 Presence of intraocular lens: Secondary | ICD-10-CM | POA: Diagnosis not present

## 2018-02-04 DIAGNOSIS — H25812 Combined forms of age-related cataract, left eye: Secondary | ICD-10-CM | POA: Diagnosis not present

## 2018-02-04 DIAGNOSIS — H2512 Age-related nuclear cataract, left eye: Secondary | ICD-10-CM | POA: Diagnosis not present

## 2018-02-16 DIAGNOSIS — E785 Hyperlipidemia, unspecified: Secondary | ICD-10-CM | POA: Diagnosis not present

## 2018-02-16 DIAGNOSIS — Z79899 Other long term (current) drug therapy: Secondary | ICD-10-CM | POA: Diagnosis not present

## 2018-02-16 DIAGNOSIS — I10 Essential (primary) hypertension: Secondary | ICD-10-CM | POA: Diagnosis not present

## 2018-02-16 DIAGNOSIS — E039 Hypothyroidism, unspecified: Secondary | ICD-10-CM | POA: Diagnosis not present

## 2018-02-25 DIAGNOSIS — Z961 Presence of intraocular lens: Secondary | ICD-10-CM | POA: Diagnosis not present

## 2018-07-14 DIAGNOSIS — I10 Essential (primary) hypertension: Secondary | ICD-10-CM | POA: Diagnosis not present

## 2018-07-14 DIAGNOSIS — Z0000 Encounter for general adult medical examination without abnormal findings: Secondary | ICD-10-CM | POA: Diagnosis not present

## 2018-07-14 DIAGNOSIS — E039 Hypothyroidism, unspecified: Secondary | ICD-10-CM | POA: Diagnosis not present

## 2018-09-27 DIAGNOSIS — K644 Residual hemorrhoidal skin tags: Secondary | ICD-10-CM | POA: Diagnosis not present

## 2018-11-22 DIAGNOSIS — Z23 Encounter for immunization: Secondary | ICD-10-CM | POA: Diagnosis not present

## 2019-01-04 DIAGNOSIS — E7800 Pure hypercholesterolemia, unspecified: Secondary | ICD-10-CM | POA: Diagnosis not present

## 2019-01-04 DIAGNOSIS — Z79899 Other long term (current) drug therapy: Secondary | ICD-10-CM | POA: Diagnosis not present

## 2019-01-04 DIAGNOSIS — E039 Hypothyroidism, unspecified: Secondary | ICD-10-CM | POA: Diagnosis not present

## 2019-06-14 DIAGNOSIS — E039 Hypothyroidism, unspecified: Secondary | ICD-10-CM | POA: Diagnosis not present

## 2019-06-14 DIAGNOSIS — Z79899 Other long term (current) drug therapy: Secondary | ICD-10-CM | POA: Diagnosis not present

## 2019-06-14 DIAGNOSIS — R6889 Other general symptoms and signs: Secondary | ICD-10-CM | POA: Diagnosis not present

## 2019-06-14 DIAGNOSIS — Z0000 Encounter for general adult medical examination without abnormal findings: Secondary | ICD-10-CM | POA: Diagnosis not present

## 2019-06-14 DIAGNOSIS — E7800 Pure hypercholesterolemia, unspecified: Secondary | ICD-10-CM | POA: Diagnosis not present

## 2019-08-09 DIAGNOSIS — M7631 Iliotibial band syndrome, right leg: Secondary | ICD-10-CM | POA: Diagnosis not present

## 2019-10-06 DIAGNOSIS — M7061 Trochanteric bursitis, right hip: Secondary | ICD-10-CM | POA: Diagnosis not present

## 2019-12-28 DIAGNOSIS — E7800 Pure hypercholesterolemia, unspecified: Secondary | ICD-10-CM | POA: Diagnosis not present

## 2019-12-28 DIAGNOSIS — Z79899 Other long term (current) drug therapy: Secondary | ICD-10-CM | POA: Diagnosis not present

## 2019-12-28 DIAGNOSIS — E039 Hypothyroidism, unspecified: Secondary | ICD-10-CM | POA: Diagnosis not present

## 2020-01-04 DIAGNOSIS — Z743 Need for continuous supervision: Secondary | ICD-10-CM | POA: Diagnosis not present

## 2020-01-04 DIAGNOSIS — R52 Pain, unspecified: Secondary | ICD-10-CM | POA: Diagnosis not present

## 2020-01-04 DIAGNOSIS — F17210 Nicotine dependence, cigarettes, uncomplicated: Secondary | ICD-10-CM | POA: Diagnosis not present

## 2020-01-04 DIAGNOSIS — M79604 Pain in right leg: Secondary | ICD-10-CM | POA: Diagnosis not present

## 2020-01-04 DIAGNOSIS — G8929 Other chronic pain: Secondary | ICD-10-CM | POA: Diagnosis not present

## 2020-01-04 DIAGNOSIS — M898X6 Other specified disorders of bone, lower leg: Secondary | ICD-10-CM | POA: Diagnosis not present

## 2020-01-04 DIAGNOSIS — F17200 Nicotine dependence, unspecified, uncomplicated: Secondary | ICD-10-CM | POA: Diagnosis not present

## 2020-01-04 DIAGNOSIS — Z471 Aftercare following joint replacement surgery: Secondary | ICD-10-CM | POA: Diagnosis not present

## 2020-01-04 DIAGNOSIS — M25551 Pain in right hip: Secondary | ICD-10-CM | POA: Diagnosis not present

## 2020-01-04 DIAGNOSIS — M5136 Other intervertebral disc degeneration, lumbar region: Secondary | ICD-10-CM | POA: Diagnosis not present

## 2020-01-04 DIAGNOSIS — M545 Low back pain, unspecified: Secondary | ICD-10-CM | POA: Diagnosis not present

## 2020-01-04 DIAGNOSIS — M87874 Other osteonecrosis, right foot: Secondary | ICD-10-CM | POA: Diagnosis not present

## 2020-01-04 DIAGNOSIS — M1611 Unilateral primary osteoarthritis, right hip: Secondary | ICD-10-CM | POA: Diagnosis not present

## 2020-01-04 DIAGNOSIS — E7800 Pure hypercholesterolemia, unspecified: Secondary | ICD-10-CM | POA: Diagnosis not present

## 2020-01-04 DIAGNOSIS — R69 Illness, unspecified: Secondary | ICD-10-CM | POA: Diagnosis not present

## 2020-01-10 DIAGNOSIS — M5416 Radiculopathy, lumbar region: Secondary | ICD-10-CM | POA: Diagnosis not present

## 2020-01-13 DIAGNOSIS — M5116 Intervertebral disc disorders with radiculopathy, lumbar region: Secondary | ICD-10-CM | POA: Diagnosis not present

## 2020-01-13 DIAGNOSIS — M4726 Other spondylosis with radiculopathy, lumbar region: Secondary | ICD-10-CM | POA: Diagnosis not present

## 2020-01-13 DIAGNOSIS — M5117 Intervertebral disc disorders with radiculopathy, lumbosacral region: Secondary | ICD-10-CM | POA: Diagnosis not present

## 2020-01-13 DIAGNOSIS — E278 Other specified disorders of adrenal gland: Secondary | ICD-10-CM | POA: Diagnosis not present

## 2020-01-19 DIAGNOSIS — M5416 Radiculopathy, lumbar region: Secondary | ICD-10-CM | POA: Diagnosis not present

## 2020-01-30 DIAGNOSIS — M47817 Spondylosis without myelopathy or radiculopathy, lumbosacral region: Secondary | ICD-10-CM | POA: Diagnosis not present

## 2020-05-21 DIAGNOSIS — J40 Bronchitis, not specified as acute or chronic: Secondary | ICD-10-CM | POA: Diagnosis not present

## 2020-05-24 DIAGNOSIS — J189 Pneumonia, unspecified organism: Secondary | ICD-10-CM | POA: Diagnosis not present

## 2020-05-24 DIAGNOSIS — I10 Essential (primary) hypertension: Secondary | ICD-10-CM | POA: Diagnosis not present

## 2020-05-24 DIAGNOSIS — Z20828 Contact with and (suspected) exposure to other viral communicable diseases: Secondary | ICD-10-CM | POA: Diagnosis not present

## 2020-05-24 DIAGNOSIS — R051 Acute cough: Secondary | ICD-10-CM | POA: Diagnosis not present

## 2020-05-24 DIAGNOSIS — R0981 Nasal congestion: Secondary | ICD-10-CM | POA: Diagnosis not present

## 2020-05-24 DIAGNOSIS — J209 Acute bronchitis, unspecified: Secondary | ICD-10-CM | POA: Diagnosis not present

## 2020-06-05 DIAGNOSIS — Z1331 Encounter for screening for depression: Secondary | ICD-10-CM | POA: Diagnosis not present

## 2020-06-05 DIAGNOSIS — E7800 Pure hypercholesterolemia, unspecified: Secondary | ICD-10-CM | POA: Diagnosis not present

## 2020-06-05 DIAGNOSIS — Z6841 Body Mass Index (BMI) 40.0 and over, adult: Secondary | ICD-10-CM | POA: Diagnosis not present

## 2020-06-05 DIAGNOSIS — E039 Hypothyroidism, unspecified: Secondary | ICD-10-CM | POA: Diagnosis not present

## 2020-06-05 DIAGNOSIS — R69 Illness, unspecified: Secondary | ICD-10-CM | POA: Diagnosis not present

## 2020-06-05 DIAGNOSIS — Z0000 Encounter for general adult medical examination without abnormal findings: Secondary | ICD-10-CM | POA: Diagnosis not present

## 2020-06-05 DIAGNOSIS — G43001 Migraine without aura, not intractable, with status migrainosus: Secondary | ICD-10-CM | POA: Diagnosis not present

## 2020-06-05 DIAGNOSIS — J189 Pneumonia, unspecified organism: Secondary | ICD-10-CM | POA: Diagnosis not present

## 2020-06-05 DIAGNOSIS — Z79899 Other long term (current) drug therapy: Secondary | ICD-10-CM | POA: Diagnosis not present

## 2020-06-13 DIAGNOSIS — M47816 Spondylosis without myelopathy or radiculopathy, lumbar region: Secondary | ICD-10-CM | POA: Diagnosis not present

## 2020-06-13 DIAGNOSIS — M5416 Radiculopathy, lumbar region: Secondary | ICD-10-CM | POA: Diagnosis not present

## 2020-08-08 DIAGNOSIS — M5416 Radiculopathy, lumbar region: Secondary | ICD-10-CM | POA: Diagnosis not present

## 2020-08-08 DIAGNOSIS — M48062 Spinal stenosis, lumbar region with neurogenic claudication: Secondary | ICD-10-CM | POA: Diagnosis not present

## 2020-08-08 DIAGNOSIS — M47816 Spondylosis without myelopathy or radiculopathy, lumbar region: Secondary | ICD-10-CM | POA: Diagnosis not present

## 2020-08-08 DIAGNOSIS — G894 Chronic pain syndrome: Secondary | ICD-10-CM | POA: Diagnosis not present

## 2020-09-11 DIAGNOSIS — M5416 Radiculopathy, lumbar region: Secondary | ICD-10-CM | POA: Diagnosis not present

## 2020-10-02 DIAGNOSIS — M47816 Spondylosis without myelopathy or radiculopathy, lumbar region: Secondary | ICD-10-CM | POA: Diagnosis not present

## 2020-11-01 DIAGNOSIS — M47816 Spondylosis without myelopathy or radiculopathy, lumbar region: Secondary | ICD-10-CM | POA: Diagnosis not present

## 2020-11-07 DIAGNOSIS — M48062 Spinal stenosis, lumbar region with neurogenic claudication: Secondary | ICD-10-CM | POA: Diagnosis not present

## 2020-11-07 DIAGNOSIS — M47816 Spondylosis without myelopathy or radiculopathy, lumbar region: Secondary | ICD-10-CM | POA: Diagnosis not present

## 2020-11-07 DIAGNOSIS — G894 Chronic pain syndrome: Secondary | ICD-10-CM | POA: Diagnosis not present

## 2020-11-07 DIAGNOSIS — M5416 Radiculopathy, lumbar region: Secondary | ICD-10-CM | POA: Diagnosis not present

## 2020-11-21 DIAGNOSIS — E7800 Pure hypercholesterolemia, unspecified: Secondary | ICD-10-CM | POA: Diagnosis not present

## 2020-11-21 DIAGNOSIS — Z79899 Other long term (current) drug therapy: Secondary | ICD-10-CM | POA: Diagnosis not present

## 2020-11-21 DIAGNOSIS — E039 Hypothyroidism, unspecified: Secondary | ICD-10-CM | POA: Diagnosis not present

## 2020-11-21 DIAGNOSIS — Z6841 Body Mass Index (BMI) 40.0 and over, adult: Secondary | ICD-10-CM | POA: Diagnosis not present

## 2020-11-21 DIAGNOSIS — R69 Illness, unspecified: Secondary | ICD-10-CM | POA: Diagnosis not present

## 2020-11-21 DIAGNOSIS — Z1331 Encounter for screening for depression: Secondary | ICD-10-CM | POA: Diagnosis not present

## 2020-11-21 DIAGNOSIS — G43001 Migraine without aura, not intractable, with status migrainosus: Secondary | ICD-10-CM | POA: Diagnosis not present

## 2020-11-21 DIAGNOSIS — I10 Essential (primary) hypertension: Secondary | ICD-10-CM | POA: Diagnosis not present

## 2020-11-21 DIAGNOSIS — Z23 Encounter for immunization: Secondary | ICD-10-CM | POA: Diagnosis not present

## 2020-12-06 DIAGNOSIS — M47816 Spondylosis without myelopathy or radiculopathy, lumbar region: Secondary | ICD-10-CM | POA: Diagnosis not present

## 2021-01-15 DIAGNOSIS — M47816 Spondylosis without myelopathy or radiculopathy, lumbar region: Secondary | ICD-10-CM | POA: Diagnosis not present

## 2021-01-18 DIAGNOSIS — R5381 Other malaise: Secondary | ICD-10-CM | POA: Diagnosis not present

## 2021-01-18 DIAGNOSIS — J40 Bronchitis, not specified as acute or chronic: Secondary | ICD-10-CM | POA: Diagnosis not present

## 2021-01-18 DIAGNOSIS — R5383 Other fatigue: Secondary | ICD-10-CM | POA: Diagnosis not present

## 2021-01-18 DIAGNOSIS — Z20828 Contact with and (suspected) exposure to other viral communicable diseases: Secondary | ICD-10-CM | POA: Diagnosis not present

## 2021-01-18 DIAGNOSIS — I10 Essential (primary) hypertension: Secondary | ICD-10-CM | POA: Diagnosis not present

## 2021-01-18 DIAGNOSIS — J329 Chronic sinusitis, unspecified: Secondary | ICD-10-CM | POA: Diagnosis not present

## 2021-01-22 DIAGNOSIS — Z6841 Body Mass Index (BMI) 40.0 and over, adult: Secondary | ICD-10-CM | POA: Diagnosis not present

## 2021-01-22 DIAGNOSIS — I10 Essential (primary) hypertension: Secondary | ICD-10-CM | POA: Diagnosis not present

## 2021-06-05 DIAGNOSIS — M47816 Spondylosis without myelopathy or radiculopathy, lumbar region: Secondary | ICD-10-CM | POA: Diagnosis not present

## 2021-06-05 DIAGNOSIS — G894 Chronic pain syndrome: Secondary | ICD-10-CM | POA: Diagnosis not present

## 2021-06-05 DIAGNOSIS — M48062 Spinal stenosis, lumbar region with neurogenic claudication: Secondary | ICD-10-CM | POA: Diagnosis not present

## 2021-06-05 DIAGNOSIS — M5416 Radiculopathy, lumbar region: Secondary | ICD-10-CM | POA: Diagnosis not present

## 2021-06-10 DIAGNOSIS — E039 Hypothyroidism, unspecified: Secondary | ICD-10-CM | POA: Diagnosis not present

## 2021-06-10 DIAGNOSIS — R0981 Nasal congestion: Secondary | ICD-10-CM | POA: Diagnosis not present

## 2021-06-10 DIAGNOSIS — G2581 Restless legs syndrome: Secondary | ICD-10-CM | POA: Diagnosis not present

## 2021-06-10 DIAGNOSIS — I10 Essential (primary) hypertension: Secondary | ICD-10-CM | POA: Diagnosis not present

## 2021-06-10 DIAGNOSIS — Z0000 Encounter for general adult medical examination without abnormal findings: Secondary | ICD-10-CM | POA: Diagnosis not present

## 2021-06-10 DIAGNOSIS — Z79899 Other long term (current) drug therapy: Secondary | ICD-10-CM | POA: Diagnosis not present

## 2021-06-16 DIAGNOSIS — R109 Unspecified abdominal pain: Secondary | ICD-10-CM | POA: Diagnosis not present

## 2021-06-16 DIAGNOSIS — K625 Hemorrhage of anus and rectum: Secondary | ICD-10-CM | POA: Diagnosis not present

## 2021-06-16 DIAGNOSIS — E875 Hyperkalemia: Secondary | ICD-10-CM | POA: Diagnosis not present

## 2021-06-16 DIAGNOSIS — R1030 Lower abdominal pain, unspecified: Secondary | ICD-10-CM | POA: Diagnosis not present

## 2021-06-20 DIAGNOSIS — E875 Hyperkalemia: Secondary | ICD-10-CM | POA: Diagnosis not present

## 2021-07-22 DIAGNOSIS — R1310 Dysphagia, unspecified: Secondary | ICD-10-CM | POA: Diagnosis not present

## 2021-07-22 DIAGNOSIS — K625 Hemorrhage of anus and rectum: Secondary | ICD-10-CM | POA: Diagnosis not present

## 2021-07-22 DIAGNOSIS — K5900 Constipation, unspecified: Secondary | ICD-10-CM | POA: Diagnosis not present

## 2021-07-22 DIAGNOSIS — K649 Unspecified hemorrhoids: Secondary | ICD-10-CM | POA: Diagnosis not present

## 2021-08-24 DIAGNOSIS — R0789 Other chest pain: Secondary | ICD-10-CM | POA: Diagnosis not present

## 2021-08-24 DIAGNOSIS — T50995A Adverse effect of other drugs, medicaments and biological substances, initial encounter: Secondary | ICD-10-CM | POA: Diagnosis not present

## 2021-08-24 DIAGNOSIS — R110 Nausea: Secondary | ICD-10-CM | POA: Diagnosis not present

## 2021-08-24 DIAGNOSIS — F32A Depression, unspecified: Secondary | ICD-10-CM | POA: Diagnosis not present

## 2021-08-24 DIAGNOSIS — I2510 Atherosclerotic heart disease of native coronary artery without angina pectoris: Secondary | ICD-10-CM | POA: Diagnosis not present

## 2021-08-24 DIAGNOSIS — F17210 Nicotine dependence, cigarettes, uncomplicated: Secondary | ICD-10-CM | POA: Diagnosis not present

## 2021-08-24 DIAGNOSIS — R001 Bradycardia, unspecified: Secondary | ICD-10-CM | POA: Diagnosis not present

## 2021-08-24 DIAGNOSIS — I509 Heart failure, unspecified: Secondary | ICD-10-CM | POA: Diagnosis not present

## 2021-08-24 DIAGNOSIS — I110 Hypertensive heart disease with heart failure: Secondary | ICD-10-CM | POA: Diagnosis not present

## 2021-08-24 DIAGNOSIS — G894 Chronic pain syndrome: Secondary | ICD-10-CM | POA: Diagnosis not present

## 2021-08-24 DIAGNOSIS — R079 Chest pain, unspecified: Secondary | ICD-10-CM | POA: Diagnosis not present

## 2021-08-24 DIAGNOSIS — E875 Hyperkalemia: Secondary | ICD-10-CM | POA: Diagnosis not present

## 2021-08-24 DIAGNOSIS — I10 Essential (primary) hypertension: Secondary | ICD-10-CM | POA: Diagnosis not present

## 2021-08-24 DIAGNOSIS — R0602 Shortness of breath: Secondary | ICD-10-CM | POA: Diagnosis not present

## 2021-08-24 DIAGNOSIS — M545 Low back pain, unspecified: Secondary | ICD-10-CM | POA: Diagnosis not present

## 2021-08-24 DIAGNOSIS — Z7982 Long term (current) use of aspirin: Secondary | ICD-10-CM | POA: Diagnosis not present

## 2021-08-24 DIAGNOSIS — R531 Weakness: Secondary | ICD-10-CM | POA: Diagnosis not present

## 2021-08-24 DIAGNOSIS — R42 Dizziness and giddiness: Secondary | ICD-10-CM | POA: Diagnosis not present

## 2021-08-24 DIAGNOSIS — N179 Acute kidney failure, unspecified: Secondary | ICD-10-CM | POA: Diagnosis not present

## 2021-08-24 DIAGNOSIS — R197 Diarrhea, unspecified: Secondary | ICD-10-CM | POA: Diagnosis not present

## 2021-08-24 DIAGNOSIS — Z79899 Other long term (current) drug therapy: Secondary | ICD-10-CM | POA: Diagnosis not present

## 2021-08-24 DIAGNOSIS — T447X5A Adverse effect of beta-adrenoreceptor antagonists, initial encounter: Secondary | ICD-10-CM | POA: Diagnosis not present

## 2021-08-24 DIAGNOSIS — T461X5A Adverse effect of calcium-channel blockers, initial encounter: Secondary | ICD-10-CM | POA: Diagnosis not present

## 2021-08-24 DIAGNOSIS — E039 Hypothyroidism, unspecified: Secondary | ICD-10-CM | POA: Diagnosis not present

## 2021-08-29 DIAGNOSIS — E875 Hyperkalemia: Secondary | ICD-10-CM | POA: Diagnosis not present

## 2021-09-01 DIAGNOSIS — E039 Hypothyroidism, unspecified: Secondary | ICD-10-CM | POA: Diagnosis not present

## 2021-09-01 DIAGNOSIS — R109 Unspecified abdominal pain: Secondary | ICD-10-CM | POA: Diagnosis not present

## 2021-09-01 DIAGNOSIS — Z881 Allergy status to other antibiotic agents status: Secondary | ICD-10-CM | POA: Diagnosis not present

## 2021-09-01 DIAGNOSIS — Z20822 Contact with and (suspected) exposure to covid-19: Secondary | ICD-10-CM | POA: Diagnosis not present

## 2021-09-01 DIAGNOSIS — Z880 Allergy status to penicillin: Secondary | ICD-10-CM | POA: Diagnosis not present

## 2021-09-01 DIAGNOSIS — M1990 Unspecified osteoarthritis, unspecified site: Secondary | ICD-10-CM | POA: Diagnosis not present

## 2021-09-01 DIAGNOSIS — Z79899 Other long term (current) drug therapy: Secondary | ICD-10-CM | POA: Diagnosis not present

## 2021-09-01 DIAGNOSIS — E872 Acidosis, unspecified: Secondary | ICD-10-CM | POA: Diagnosis not present

## 2021-09-01 DIAGNOSIS — F17210 Nicotine dependence, cigarettes, uncomplicated: Secondary | ICD-10-CM | POA: Diagnosis not present

## 2021-09-01 DIAGNOSIS — I639 Cerebral infarction, unspecified: Secondary | ICD-10-CM | POA: Diagnosis not present

## 2021-09-01 DIAGNOSIS — Z885 Allergy status to narcotic agent status: Secondary | ICD-10-CM | POA: Diagnosis not present

## 2021-09-01 DIAGNOSIS — S0990XA Unspecified injury of head, initial encounter: Secondary | ICD-10-CM | POA: Diagnosis not present

## 2021-09-01 DIAGNOSIS — F32A Depression, unspecified: Secondary | ICD-10-CM | POA: Diagnosis not present

## 2021-09-01 DIAGNOSIS — I10 Essential (primary) hypertension: Secondary | ICD-10-CM | POA: Diagnosis not present

## 2021-09-01 DIAGNOSIS — I959 Hypotension, unspecified: Secondary | ICD-10-CM | POA: Diagnosis not present

## 2021-09-01 DIAGNOSIS — R0789 Other chest pain: Secondary | ICD-10-CM | POA: Diagnosis not present

## 2021-09-01 DIAGNOSIS — I63232 Cerebral infarction due to unspecified occlusion or stenosis of left carotid arteries: Secondary | ICD-10-CM | POA: Diagnosis not present

## 2021-09-03 DIAGNOSIS — I639 Cerebral infarction, unspecified: Secondary | ICD-10-CM

## 2021-09-03 HISTORY — DX: Cerebral infarction, unspecified: I63.9

## 2021-09-04 DIAGNOSIS — Z880 Allergy status to penicillin: Secondary | ICD-10-CM | POA: Diagnosis not present

## 2021-09-04 DIAGNOSIS — I6523 Occlusion and stenosis of bilateral carotid arteries: Secondary | ICD-10-CM | POA: Diagnosis not present

## 2021-09-04 DIAGNOSIS — I110 Hypertensive heart disease with heart failure: Secondary | ICD-10-CM | POA: Diagnosis not present

## 2021-09-04 DIAGNOSIS — Z9049 Acquired absence of other specified parts of digestive tract: Secondary | ICD-10-CM | POA: Diagnosis not present

## 2021-09-04 DIAGNOSIS — I63232 Cerebral infarction due to unspecified occlusion or stenosis of left carotid arteries: Secondary | ICD-10-CM | POA: Diagnosis not present

## 2021-09-04 DIAGNOSIS — I6340 Cerebral infarction due to embolism of unspecified cerebral artery: Secondary | ICD-10-CM | POA: Diagnosis not present

## 2021-09-04 DIAGNOSIS — Z720 Tobacco use: Secondary | ICD-10-CM | POA: Diagnosis not present

## 2021-09-04 DIAGNOSIS — I63432 Cerebral infarction due to embolism of left posterior cerebral artery: Secondary | ICD-10-CM | POA: Diagnosis not present

## 2021-09-04 DIAGNOSIS — Z8249 Family history of ischemic heart disease and other diseases of the circulatory system: Secondary | ICD-10-CM | POA: Diagnosis not present

## 2021-09-04 DIAGNOSIS — Z743 Need for continuous supervision: Secondary | ICD-10-CM | POA: Diagnosis not present

## 2021-09-04 DIAGNOSIS — I10 Essential (primary) hypertension: Secondary | ICD-10-CM | POA: Diagnosis not present

## 2021-09-04 DIAGNOSIS — I6522 Occlusion and stenosis of left carotid artery: Secondary | ICD-10-CM | POA: Diagnosis not present

## 2021-09-04 DIAGNOSIS — I639 Cerebral infarction, unspecified: Secondary | ICD-10-CM | POA: Diagnosis not present

## 2021-09-04 DIAGNOSIS — R001 Bradycardia, unspecified: Secondary | ICD-10-CM | POA: Diagnosis not present

## 2021-09-04 DIAGNOSIS — I2510 Atherosclerotic heart disease of native coronary artery without angina pectoris: Secondary | ICD-10-CM | POA: Diagnosis not present

## 2021-09-04 DIAGNOSIS — E785 Hyperlipidemia, unspecified: Secondary | ICD-10-CM | POA: Diagnosis not present

## 2021-09-04 DIAGNOSIS — R29898 Other symptoms and signs involving the musculoskeletal system: Secondary | ICD-10-CM | POA: Diagnosis not present

## 2021-09-04 DIAGNOSIS — Z7902 Long term (current) use of antithrombotics/antiplatelets: Secondary | ICD-10-CM | POA: Diagnosis not present

## 2021-09-04 DIAGNOSIS — I63442 Cerebral infarction due to embolism of left cerebellar artery: Secondary | ICD-10-CM | POA: Diagnosis not present

## 2021-09-04 DIAGNOSIS — Z79899 Other long term (current) drug therapy: Secondary | ICD-10-CM | POA: Diagnosis not present

## 2021-09-04 DIAGNOSIS — Z7982 Long term (current) use of aspirin: Secondary | ICD-10-CM | POA: Diagnosis not present

## 2021-09-04 DIAGNOSIS — I5032 Chronic diastolic (congestive) heart failure: Secondary | ICD-10-CM | POA: Diagnosis not present

## 2021-09-04 DIAGNOSIS — Z823 Family history of stroke: Secondary | ICD-10-CM | POA: Diagnosis not present

## 2021-09-04 DIAGNOSIS — G8321 Monoplegia of upper limb affecting right dominant side: Secondary | ICD-10-CM | POA: Diagnosis not present

## 2021-09-04 DIAGNOSIS — I6389 Other cerebral infarction: Secondary | ICD-10-CM | POA: Diagnosis not present

## 2021-09-04 DIAGNOSIS — E039 Hypothyroidism, unspecified: Secondary | ICD-10-CM | POA: Diagnosis not present

## 2021-09-04 DIAGNOSIS — Z888 Allergy status to other drugs, medicaments and biological substances status: Secondary | ICD-10-CM | POA: Diagnosis not present

## 2021-09-04 DIAGNOSIS — F17210 Nicotine dependence, cigarettes, uncomplicated: Secondary | ICD-10-CM | POA: Diagnosis not present

## 2021-09-04 DIAGNOSIS — G464 Cerebellar stroke syndrome: Secondary | ICD-10-CM | POA: Diagnosis not present

## 2021-09-04 DIAGNOSIS — I63132 Cerebral infarction due to embolism of left carotid artery: Secondary | ICD-10-CM | POA: Diagnosis not present

## 2021-09-04 DIAGNOSIS — R29704 NIHSS score 4: Secondary | ICD-10-CM | POA: Diagnosis not present

## 2021-09-10 DIAGNOSIS — R001 Bradycardia, unspecified: Secondary | ICD-10-CM | POA: Diagnosis not present

## 2021-09-12 DIAGNOSIS — G464 Cerebellar stroke syndrome: Secondary | ICD-10-CM | POA: Diagnosis not present

## 2021-09-16 DIAGNOSIS — I6930 Unspecified sequelae of cerebral infarction: Secondary | ICD-10-CM | POA: Diagnosis not present

## 2021-09-16 DIAGNOSIS — R300 Dysuria: Secondary | ICD-10-CM | POA: Diagnosis not present

## 2021-09-18 DIAGNOSIS — Z87891 Personal history of nicotine dependence: Secondary | ICD-10-CM | POA: Diagnosis not present

## 2021-09-18 DIAGNOSIS — R300 Dysuria: Secondary | ICD-10-CM | POA: Diagnosis not present

## 2021-09-18 DIAGNOSIS — E7800 Pure hypercholesterolemia, unspecified: Secondary | ICD-10-CM | POA: Diagnosis not present

## 2021-09-18 DIAGNOSIS — Z9049 Acquired absence of other specified parts of digestive tract: Secondary | ICD-10-CM | POA: Diagnosis not present

## 2021-09-18 DIAGNOSIS — Z792 Long term (current) use of antibiotics: Secondary | ICD-10-CM | POA: Diagnosis not present

## 2021-09-18 DIAGNOSIS — Z7982 Long term (current) use of aspirin: Secondary | ICD-10-CM | POA: Diagnosis not present

## 2021-09-18 DIAGNOSIS — Z9181 History of falling: Secondary | ICD-10-CM | POA: Diagnosis not present

## 2021-09-18 DIAGNOSIS — I69351 Hemiplegia and hemiparesis following cerebral infarction affecting right dominant side: Secondary | ICD-10-CM | POA: Diagnosis not present

## 2021-09-18 DIAGNOSIS — Z96652 Presence of left artificial knee joint: Secondary | ICD-10-CM | POA: Diagnosis not present

## 2021-09-18 DIAGNOSIS — I6522 Occlusion and stenosis of left carotid artery: Secondary | ICD-10-CM | POA: Diagnosis not present

## 2021-09-18 DIAGNOSIS — N390 Urinary tract infection, site not specified: Secondary | ICD-10-CM | POA: Diagnosis not present

## 2021-09-18 DIAGNOSIS — Z7902 Long term (current) use of antithrombotics/antiplatelets: Secondary | ICD-10-CM | POA: Diagnosis not present

## 2021-09-18 DIAGNOSIS — I69328 Other speech and language deficits following cerebral infarction: Secondary | ICD-10-CM | POA: Diagnosis not present

## 2021-09-18 DIAGNOSIS — G43001 Migraine without aura, not intractable, with status migrainosus: Secondary | ICD-10-CM | POA: Diagnosis not present

## 2021-09-20 DIAGNOSIS — Z96652 Presence of left artificial knee joint: Secondary | ICD-10-CM | POA: Diagnosis not present

## 2021-09-20 DIAGNOSIS — I69351 Hemiplegia and hemiparesis following cerebral infarction affecting right dominant side: Secondary | ICD-10-CM | POA: Diagnosis not present

## 2021-09-20 DIAGNOSIS — Z792 Long term (current) use of antibiotics: Secondary | ICD-10-CM | POA: Diagnosis not present

## 2021-09-20 DIAGNOSIS — G43001 Migraine without aura, not intractable, with status migrainosus: Secondary | ICD-10-CM | POA: Diagnosis not present

## 2021-09-20 DIAGNOSIS — Z9181 History of falling: Secondary | ICD-10-CM | POA: Diagnosis not present

## 2021-09-20 DIAGNOSIS — I6522 Occlusion and stenosis of left carotid artery: Secondary | ICD-10-CM | POA: Diagnosis not present

## 2021-09-20 DIAGNOSIS — Z9049 Acquired absence of other specified parts of digestive tract: Secondary | ICD-10-CM | POA: Diagnosis not present

## 2021-09-20 DIAGNOSIS — E7800 Pure hypercholesterolemia, unspecified: Secondary | ICD-10-CM | POA: Diagnosis not present

## 2021-09-20 DIAGNOSIS — N390 Urinary tract infection, site not specified: Secondary | ICD-10-CM | POA: Diagnosis not present

## 2021-09-20 DIAGNOSIS — Z87891 Personal history of nicotine dependence: Secondary | ICD-10-CM | POA: Diagnosis not present

## 2021-09-20 DIAGNOSIS — I69328 Other speech and language deficits following cerebral infarction: Secondary | ICD-10-CM | POA: Diagnosis not present

## 2021-09-20 DIAGNOSIS — Z7902 Long term (current) use of antithrombotics/antiplatelets: Secondary | ICD-10-CM | POA: Diagnosis not present

## 2021-09-20 DIAGNOSIS — Z7982 Long term (current) use of aspirin: Secondary | ICD-10-CM | POA: Diagnosis not present

## 2021-09-20 DIAGNOSIS — R300 Dysuria: Secondary | ICD-10-CM | POA: Diagnosis not present

## 2021-09-23 DIAGNOSIS — Z7982 Long term (current) use of aspirin: Secondary | ICD-10-CM | POA: Diagnosis not present

## 2021-09-23 DIAGNOSIS — N390 Urinary tract infection, site not specified: Secondary | ICD-10-CM | POA: Diagnosis not present

## 2021-09-23 DIAGNOSIS — G43001 Migraine without aura, not intractable, with status migrainosus: Secondary | ICD-10-CM | POA: Diagnosis not present

## 2021-09-23 DIAGNOSIS — R300 Dysuria: Secondary | ICD-10-CM | POA: Diagnosis not present

## 2021-09-23 DIAGNOSIS — I69328 Other speech and language deficits following cerebral infarction: Secondary | ICD-10-CM | POA: Diagnosis not present

## 2021-09-23 DIAGNOSIS — Z792 Long term (current) use of antibiotics: Secondary | ICD-10-CM | POA: Diagnosis not present

## 2021-09-23 DIAGNOSIS — Z96652 Presence of left artificial knee joint: Secondary | ICD-10-CM | POA: Diagnosis not present

## 2021-09-23 DIAGNOSIS — Z87891 Personal history of nicotine dependence: Secondary | ICD-10-CM | POA: Diagnosis not present

## 2021-09-23 DIAGNOSIS — Z7902 Long term (current) use of antithrombotics/antiplatelets: Secondary | ICD-10-CM | POA: Diagnosis not present

## 2021-09-23 DIAGNOSIS — Z9049 Acquired absence of other specified parts of digestive tract: Secondary | ICD-10-CM | POA: Diagnosis not present

## 2021-09-23 DIAGNOSIS — I6522 Occlusion and stenosis of left carotid artery: Secondary | ICD-10-CM | POA: Diagnosis not present

## 2021-09-23 DIAGNOSIS — Z9181 History of falling: Secondary | ICD-10-CM | POA: Diagnosis not present

## 2021-09-23 DIAGNOSIS — I69351 Hemiplegia and hemiparesis following cerebral infarction affecting right dominant side: Secondary | ICD-10-CM | POA: Diagnosis not present

## 2021-09-23 DIAGNOSIS — E7800 Pure hypercholesterolemia, unspecified: Secondary | ICD-10-CM | POA: Diagnosis not present

## 2021-09-24 DIAGNOSIS — Z87891 Personal history of nicotine dependence: Secondary | ICD-10-CM | POA: Diagnosis not present

## 2021-09-24 DIAGNOSIS — R300 Dysuria: Secondary | ICD-10-CM | POA: Diagnosis not present

## 2021-09-24 DIAGNOSIS — Z96652 Presence of left artificial knee joint: Secondary | ICD-10-CM | POA: Diagnosis not present

## 2021-09-24 DIAGNOSIS — Z7902 Long term (current) use of antithrombotics/antiplatelets: Secondary | ICD-10-CM | POA: Diagnosis not present

## 2021-09-24 DIAGNOSIS — Z792 Long term (current) use of antibiotics: Secondary | ICD-10-CM | POA: Diagnosis not present

## 2021-09-24 DIAGNOSIS — Z9181 History of falling: Secondary | ICD-10-CM | POA: Diagnosis not present

## 2021-09-24 DIAGNOSIS — Z9049 Acquired absence of other specified parts of digestive tract: Secondary | ICD-10-CM | POA: Diagnosis not present

## 2021-09-24 DIAGNOSIS — I6522 Occlusion and stenosis of left carotid artery: Secondary | ICD-10-CM | POA: Diagnosis not present

## 2021-09-24 DIAGNOSIS — E7800 Pure hypercholesterolemia, unspecified: Secondary | ICD-10-CM | POA: Diagnosis not present

## 2021-09-24 DIAGNOSIS — Z7982 Long term (current) use of aspirin: Secondary | ICD-10-CM | POA: Diagnosis not present

## 2021-09-24 DIAGNOSIS — I69328 Other speech and language deficits following cerebral infarction: Secondary | ICD-10-CM | POA: Diagnosis not present

## 2021-09-24 DIAGNOSIS — I69351 Hemiplegia and hemiparesis following cerebral infarction affecting right dominant side: Secondary | ICD-10-CM | POA: Diagnosis not present

## 2021-09-24 DIAGNOSIS — G43001 Migraine without aura, not intractable, with status migrainosus: Secondary | ICD-10-CM | POA: Diagnosis not present

## 2021-09-24 DIAGNOSIS — N390 Urinary tract infection, site not specified: Secondary | ICD-10-CM | POA: Diagnosis not present

## 2021-09-25 DIAGNOSIS — N390 Urinary tract infection, site not specified: Secondary | ICD-10-CM | POA: Diagnosis not present

## 2021-09-25 DIAGNOSIS — E7800 Pure hypercholesterolemia, unspecified: Secondary | ICD-10-CM | POA: Diagnosis not present

## 2021-09-25 DIAGNOSIS — Z9049 Acquired absence of other specified parts of digestive tract: Secondary | ICD-10-CM | POA: Diagnosis not present

## 2021-09-25 DIAGNOSIS — Z7902 Long term (current) use of antithrombotics/antiplatelets: Secondary | ICD-10-CM | POA: Diagnosis not present

## 2021-09-25 DIAGNOSIS — G43001 Migraine without aura, not intractable, with status migrainosus: Secondary | ICD-10-CM | POA: Diagnosis not present

## 2021-09-25 DIAGNOSIS — I69351 Hemiplegia and hemiparesis following cerebral infarction affecting right dominant side: Secondary | ICD-10-CM | POA: Diagnosis not present

## 2021-09-25 DIAGNOSIS — Z9181 History of falling: Secondary | ICD-10-CM | POA: Diagnosis not present

## 2021-09-25 DIAGNOSIS — I6522 Occlusion and stenosis of left carotid artery: Secondary | ICD-10-CM | POA: Diagnosis not present

## 2021-09-25 DIAGNOSIS — Z87891 Personal history of nicotine dependence: Secondary | ICD-10-CM | POA: Diagnosis not present

## 2021-09-25 DIAGNOSIS — Z792 Long term (current) use of antibiotics: Secondary | ICD-10-CM | POA: Diagnosis not present

## 2021-09-25 DIAGNOSIS — Z96652 Presence of left artificial knee joint: Secondary | ICD-10-CM | POA: Diagnosis not present

## 2021-09-25 DIAGNOSIS — R300 Dysuria: Secondary | ICD-10-CM | POA: Diagnosis not present

## 2021-09-25 DIAGNOSIS — I69328 Other speech and language deficits following cerebral infarction: Secondary | ICD-10-CM | POA: Diagnosis not present

## 2021-09-25 DIAGNOSIS — Z7982 Long term (current) use of aspirin: Secondary | ICD-10-CM | POA: Diagnosis not present

## 2021-09-26 DIAGNOSIS — Z9049 Acquired absence of other specified parts of digestive tract: Secondary | ICD-10-CM | POA: Diagnosis not present

## 2021-09-26 DIAGNOSIS — Z7982 Long term (current) use of aspirin: Secondary | ICD-10-CM | POA: Diagnosis not present

## 2021-09-26 DIAGNOSIS — R300 Dysuria: Secondary | ICD-10-CM | POA: Diagnosis not present

## 2021-09-26 DIAGNOSIS — Z87891 Personal history of nicotine dependence: Secondary | ICD-10-CM | POA: Diagnosis not present

## 2021-09-26 DIAGNOSIS — I69328 Other speech and language deficits following cerebral infarction: Secondary | ICD-10-CM | POA: Diagnosis not present

## 2021-09-26 DIAGNOSIS — Z7902 Long term (current) use of antithrombotics/antiplatelets: Secondary | ICD-10-CM | POA: Diagnosis not present

## 2021-09-26 DIAGNOSIS — N390 Urinary tract infection, site not specified: Secondary | ICD-10-CM | POA: Diagnosis not present

## 2021-09-26 DIAGNOSIS — I6522 Occlusion and stenosis of left carotid artery: Secondary | ICD-10-CM | POA: Diagnosis not present

## 2021-09-26 DIAGNOSIS — Z96652 Presence of left artificial knee joint: Secondary | ICD-10-CM | POA: Diagnosis not present

## 2021-09-26 DIAGNOSIS — G43001 Migraine without aura, not intractable, with status migrainosus: Secondary | ICD-10-CM | POA: Diagnosis not present

## 2021-09-26 DIAGNOSIS — E7800 Pure hypercholesterolemia, unspecified: Secondary | ICD-10-CM | POA: Diagnosis not present

## 2021-09-26 DIAGNOSIS — I69351 Hemiplegia and hemiparesis following cerebral infarction affecting right dominant side: Secondary | ICD-10-CM | POA: Diagnosis not present

## 2021-09-26 DIAGNOSIS — Z792 Long term (current) use of antibiotics: Secondary | ICD-10-CM | POA: Diagnosis not present

## 2021-09-26 DIAGNOSIS — Z9181 History of falling: Secondary | ICD-10-CM | POA: Diagnosis not present

## 2021-09-27 DIAGNOSIS — Z9049 Acquired absence of other specified parts of digestive tract: Secondary | ICD-10-CM | POA: Diagnosis not present

## 2021-09-27 DIAGNOSIS — Z96652 Presence of left artificial knee joint: Secondary | ICD-10-CM | POA: Diagnosis not present

## 2021-09-27 DIAGNOSIS — Z7982 Long term (current) use of aspirin: Secondary | ICD-10-CM | POA: Diagnosis not present

## 2021-09-27 DIAGNOSIS — Z792 Long term (current) use of antibiotics: Secondary | ICD-10-CM | POA: Diagnosis not present

## 2021-09-27 DIAGNOSIS — I6522 Occlusion and stenosis of left carotid artery: Secondary | ICD-10-CM | POA: Diagnosis not present

## 2021-09-27 DIAGNOSIS — I69351 Hemiplegia and hemiparesis following cerebral infarction affecting right dominant side: Secondary | ICD-10-CM | POA: Diagnosis not present

## 2021-09-27 DIAGNOSIS — E7800 Pure hypercholesterolemia, unspecified: Secondary | ICD-10-CM | POA: Diagnosis not present

## 2021-09-27 DIAGNOSIS — Z87891 Personal history of nicotine dependence: Secondary | ICD-10-CM | POA: Diagnosis not present

## 2021-09-27 DIAGNOSIS — R300 Dysuria: Secondary | ICD-10-CM | POA: Diagnosis not present

## 2021-09-27 DIAGNOSIS — N390 Urinary tract infection, site not specified: Secondary | ICD-10-CM | POA: Diagnosis not present

## 2021-09-27 DIAGNOSIS — G43001 Migraine without aura, not intractable, with status migrainosus: Secondary | ICD-10-CM | POA: Diagnosis not present

## 2021-09-27 DIAGNOSIS — Z9181 History of falling: Secondary | ICD-10-CM | POA: Diagnosis not present

## 2021-09-27 DIAGNOSIS — I69328 Other speech and language deficits following cerebral infarction: Secondary | ICD-10-CM | POA: Diagnosis not present

## 2021-09-27 DIAGNOSIS — Z7902 Long term (current) use of antithrombotics/antiplatelets: Secondary | ICD-10-CM | POA: Diagnosis not present

## 2021-09-30 DIAGNOSIS — G43001 Migraine without aura, not intractable, with status migrainosus: Secondary | ICD-10-CM | POA: Diagnosis not present

## 2021-09-30 DIAGNOSIS — Z96652 Presence of left artificial knee joint: Secondary | ICD-10-CM | POA: Diagnosis not present

## 2021-09-30 DIAGNOSIS — Z87891 Personal history of nicotine dependence: Secondary | ICD-10-CM | POA: Diagnosis not present

## 2021-09-30 DIAGNOSIS — I69328 Other speech and language deficits following cerebral infarction: Secondary | ICD-10-CM | POA: Diagnosis not present

## 2021-09-30 DIAGNOSIS — N390 Urinary tract infection, site not specified: Secondary | ICD-10-CM | POA: Diagnosis not present

## 2021-09-30 DIAGNOSIS — Z7902 Long term (current) use of antithrombotics/antiplatelets: Secondary | ICD-10-CM | POA: Diagnosis not present

## 2021-09-30 DIAGNOSIS — Z9049 Acquired absence of other specified parts of digestive tract: Secondary | ICD-10-CM | POA: Diagnosis not present

## 2021-09-30 DIAGNOSIS — Z7982 Long term (current) use of aspirin: Secondary | ICD-10-CM | POA: Diagnosis not present

## 2021-09-30 DIAGNOSIS — Z792 Long term (current) use of antibiotics: Secondary | ICD-10-CM | POA: Diagnosis not present

## 2021-09-30 DIAGNOSIS — R300 Dysuria: Secondary | ICD-10-CM | POA: Diagnosis not present

## 2021-09-30 DIAGNOSIS — E7800 Pure hypercholesterolemia, unspecified: Secondary | ICD-10-CM | POA: Diagnosis not present

## 2021-09-30 DIAGNOSIS — Z9181 History of falling: Secondary | ICD-10-CM | POA: Diagnosis not present

## 2021-09-30 DIAGNOSIS — I6522 Occlusion and stenosis of left carotid artery: Secondary | ICD-10-CM | POA: Diagnosis not present

## 2021-09-30 DIAGNOSIS — I69351 Hemiplegia and hemiparesis following cerebral infarction affecting right dominant side: Secondary | ICD-10-CM | POA: Diagnosis not present

## 2021-10-02 DIAGNOSIS — Z87891 Personal history of nicotine dependence: Secondary | ICD-10-CM | POA: Diagnosis not present

## 2021-10-02 DIAGNOSIS — N390 Urinary tract infection, site not specified: Secondary | ICD-10-CM | POA: Diagnosis not present

## 2021-10-02 DIAGNOSIS — Z7902 Long term (current) use of antithrombotics/antiplatelets: Secondary | ICD-10-CM | POA: Diagnosis not present

## 2021-10-02 DIAGNOSIS — I69351 Hemiplegia and hemiparesis following cerebral infarction affecting right dominant side: Secondary | ICD-10-CM | POA: Diagnosis not present

## 2021-10-02 DIAGNOSIS — G43001 Migraine without aura, not intractable, with status migrainosus: Secondary | ICD-10-CM | POA: Diagnosis not present

## 2021-10-02 DIAGNOSIS — Z9049 Acquired absence of other specified parts of digestive tract: Secondary | ICD-10-CM | POA: Diagnosis not present

## 2021-10-02 DIAGNOSIS — I69328 Other speech and language deficits following cerebral infarction: Secondary | ICD-10-CM | POA: Diagnosis not present

## 2021-10-02 DIAGNOSIS — R300 Dysuria: Secondary | ICD-10-CM | POA: Diagnosis not present

## 2021-10-02 DIAGNOSIS — I6522 Occlusion and stenosis of left carotid artery: Secondary | ICD-10-CM | POA: Diagnosis not present

## 2021-10-02 DIAGNOSIS — Z7982 Long term (current) use of aspirin: Secondary | ICD-10-CM | POA: Diagnosis not present

## 2021-10-02 DIAGNOSIS — Z792 Long term (current) use of antibiotics: Secondary | ICD-10-CM | POA: Diagnosis not present

## 2021-10-02 DIAGNOSIS — Z96652 Presence of left artificial knee joint: Secondary | ICD-10-CM | POA: Diagnosis not present

## 2021-10-02 DIAGNOSIS — Z9181 History of falling: Secondary | ICD-10-CM | POA: Diagnosis not present

## 2021-10-02 DIAGNOSIS — E7800 Pure hypercholesterolemia, unspecified: Secondary | ICD-10-CM | POA: Diagnosis not present

## 2021-10-03 DIAGNOSIS — R300 Dysuria: Secondary | ICD-10-CM | POA: Diagnosis not present

## 2021-10-03 DIAGNOSIS — G43001 Migraine without aura, not intractable, with status migrainosus: Secondary | ICD-10-CM | POA: Diagnosis not present

## 2021-10-03 DIAGNOSIS — Z87891 Personal history of nicotine dependence: Secondary | ICD-10-CM | POA: Diagnosis not present

## 2021-10-03 DIAGNOSIS — Z792 Long term (current) use of antibiotics: Secondary | ICD-10-CM | POA: Diagnosis not present

## 2021-10-03 DIAGNOSIS — Z9181 History of falling: Secondary | ICD-10-CM | POA: Diagnosis not present

## 2021-10-03 DIAGNOSIS — Z96652 Presence of left artificial knee joint: Secondary | ICD-10-CM | POA: Diagnosis not present

## 2021-10-03 DIAGNOSIS — I69351 Hemiplegia and hemiparesis following cerebral infarction affecting right dominant side: Secondary | ICD-10-CM | POA: Diagnosis not present

## 2021-10-03 DIAGNOSIS — I69328 Other speech and language deficits following cerebral infarction: Secondary | ICD-10-CM | POA: Diagnosis not present

## 2021-10-03 DIAGNOSIS — E7800 Pure hypercholesterolemia, unspecified: Secondary | ICD-10-CM | POA: Diagnosis not present

## 2021-10-03 DIAGNOSIS — Z9049 Acquired absence of other specified parts of digestive tract: Secondary | ICD-10-CM | POA: Diagnosis not present

## 2021-10-03 DIAGNOSIS — N390 Urinary tract infection, site not specified: Secondary | ICD-10-CM | POA: Diagnosis not present

## 2021-10-03 DIAGNOSIS — Z7902 Long term (current) use of antithrombotics/antiplatelets: Secondary | ICD-10-CM | POA: Diagnosis not present

## 2021-10-03 DIAGNOSIS — Z7982 Long term (current) use of aspirin: Secondary | ICD-10-CM | POA: Diagnosis not present

## 2021-10-03 DIAGNOSIS — I6522 Occlusion and stenosis of left carotid artery: Secondary | ICD-10-CM | POA: Diagnosis not present

## 2021-10-09 DIAGNOSIS — G43001 Migraine without aura, not intractable, with status migrainosus: Secondary | ICD-10-CM | POA: Diagnosis not present

## 2021-10-09 DIAGNOSIS — Z9049 Acquired absence of other specified parts of digestive tract: Secondary | ICD-10-CM | POA: Diagnosis not present

## 2021-10-09 DIAGNOSIS — R300 Dysuria: Secondary | ICD-10-CM | POA: Diagnosis not present

## 2021-10-09 DIAGNOSIS — Z7902 Long term (current) use of antithrombotics/antiplatelets: Secondary | ICD-10-CM | POA: Diagnosis not present

## 2021-10-09 DIAGNOSIS — I69328 Other speech and language deficits following cerebral infarction: Secondary | ICD-10-CM | POA: Diagnosis not present

## 2021-10-09 DIAGNOSIS — N390 Urinary tract infection, site not specified: Secondary | ICD-10-CM | POA: Diagnosis not present

## 2021-10-09 DIAGNOSIS — I69351 Hemiplegia and hemiparesis following cerebral infarction affecting right dominant side: Secondary | ICD-10-CM | POA: Diagnosis not present

## 2021-10-09 DIAGNOSIS — Z7982 Long term (current) use of aspirin: Secondary | ICD-10-CM | POA: Diagnosis not present

## 2021-10-09 DIAGNOSIS — Z792 Long term (current) use of antibiotics: Secondary | ICD-10-CM | POA: Diagnosis not present

## 2021-10-09 DIAGNOSIS — Z9181 History of falling: Secondary | ICD-10-CM | POA: Diagnosis not present

## 2021-10-09 DIAGNOSIS — I6522 Occlusion and stenosis of left carotid artery: Secondary | ICD-10-CM | POA: Diagnosis not present

## 2021-10-09 DIAGNOSIS — Z96652 Presence of left artificial knee joint: Secondary | ICD-10-CM | POA: Diagnosis not present

## 2021-10-09 DIAGNOSIS — Z87891 Personal history of nicotine dependence: Secondary | ICD-10-CM | POA: Diagnosis not present

## 2021-10-09 DIAGNOSIS — E7800 Pure hypercholesterolemia, unspecified: Secondary | ICD-10-CM | POA: Diagnosis not present

## 2021-10-10 DIAGNOSIS — N390 Urinary tract infection, site not specified: Secondary | ICD-10-CM | POA: Diagnosis not present

## 2021-10-10 DIAGNOSIS — Z9181 History of falling: Secondary | ICD-10-CM | POA: Diagnosis not present

## 2021-10-10 DIAGNOSIS — I69328 Other speech and language deficits following cerebral infarction: Secondary | ICD-10-CM | POA: Diagnosis not present

## 2021-10-10 DIAGNOSIS — Z9049 Acquired absence of other specified parts of digestive tract: Secondary | ICD-10-CM | POA: Diagnosis not present

## 2021-10-10 DIAGNOSIS — I69351 Hemiplegia and hemiparesis following cerebral infarction affecting right dominant side: Secondary | ICD-10-CM | POA: Diagnosis not present

## 2021-10-10 DIAGNOSIS — Z87891 Personal history of nicotine dependence: Secondary | ICD-10-CM | POA: Diagnosis not present

## 2021-10-10 DIAGNOSIS — G43001 Migraine without aura, not intractable, with status migrainosus: Secondary | ICD-10-CM | POA: Diagnosis not present

## 2021-10-10 DIAGNOSIS — R300 Dysuria: Secondary | ICD-10-CM | POA: Diagnosis not present

## 2021-10-10 DIAGNOSIS — E7800 Pure hypercholesterolemia, unspecified: Secondary | ICD-10-CM | POA: Diagnosis not present

## 2021-10-10 DIAGNOSIS — I6522 Occlusion and stenosis of left carotid artery: Secondary | ICD-10-CM | POA: Diagnosis not present

## 2021-10-10 DIAGNOSIS — Z7982 Long term (current) use of aspirin: Secondary | ICD-10-CM | POA: Diagnosis not present

## 2021-10-10 DIAGNOSIS — Z7902 Long term (current) use of antithrombotics/antiplatelets: Secondary | ICD-10-CM | POA: Diagnosis not present

## 2021-10-10 DIAGNOSIS — Z792 Long term (current) use of antibiotics: Secondary | ICD-10-CM | POA: Diagnosis not present

## 2021-10-10 DIAGNOSIS — Z96652 Presence of left artificial knee joint: Secondary | ICD-10-CM | POA: Diagnosis not present

## 2021-10-15 DIAGNOSIS — N390 Urinary tract infection, site not specified: Secondary | ICD-10-CM | POA: Diagnosis not present

## 2021-10-15 DIAGNOSIS — Z96652 Presence of left artificial knee joint: Secondary | ICD-10-CM | POA: Diagnosis not present

## 2021-10-15 DIAGNOSIS — I6522 Occlusion and stenosis of left carotid artery: Secondary | ICD-10-CM | POA: Diagnosis not present

## 2021-10-15 DIAGNOSIS — Z9049 Acquired absence of other specified parts of digestive tract: Secondary | ICD-10-CM | POA: Diagnosis not present

## 2021-10-15 DIAGNOSIS — Z87891 Personal history of nicotine dependence: Secondary | ICD-10-CM | POA: Diagnosis not present

## 2021-10-15 DIAGNOSIS — Z7982 Long term (current) use of aspirin: Secondary | ICD-10-CM | POA: Diagnosis not present

## 2021-10-15 DIAGNOSIS — Z792 Long term (current) use of antibiotics: Secondary | ICD-10-CM | POA: Diagnosis not present

## 2021-10-15 DIAGNOSIS — G43001 Migraine without aura, not intractable, with status migrainosus: Secondary | ICD-10-CM | POA: Diagnosis not present

## 2021-10-15 DIAGNOSIS — R300 Dysuria: Secondary | ICD-10-CM | POA: Diagnosis not present

## 2021-10-15 DIAGNOSIS — E7800 Pure hypercholesterolemia, unspecified: Secondary | ICD-10-CM | POA: Diagnosis not present

## 2021-10-15 DIAGNOSIS — I69328 Other speech and language deficits following cerebral infarction: Secondary | ICD-10-CM | POA: Diagnosis not present

## 2021-10-15 DIAGNOSIS — I69351 Hemiplegia and hemiparesis following cerebral infarction affecting right dominant side: Secondary | ICD-10-CM | POA: Diagnosis not present

## 2021-10-15 DIAGNOSIS — Z9181 History of falling: Secondary | ICD-10-CM | POA: Diagnosis not present

## 2021-10-15 DIAGNOSIS — Z7902 Long term (current) use of antithrombotics/antiplatelets: Secondary | ICD-10-CM | POA: Diagnosis not present

## 2021-10-17 DIAGNOSIS — E7800 Pure hypercholesterolemia, unspecified: Secondary | ICD-10-CM | POA: Diagnosis not present

## 2021-10-17 DIAGNOSIS — Z9181 History of falling: Secondary | ICD-10-CM | POA: Diagnosis not present

## 2021-10-17 DIAGNOSIS — I6522 Occlusion and stenosis of left carotid artery: Secondary | ICD-10-CM | POA: Diagnosis not present

## 2021-10-17 DIAGNOSIS — Z9049 Acquired absence of other specified parts of digestive tract: Secondary | ICD-10-CM | POA: Diagnosis not present

## 2021-10-17 DIAGNOSIS — N390 Urinary tract infection, site not specified: Secondary | ICD-10-CM | POA: Diagnosis not present

## 2021-10-17 DIAGNOSIS — I69328 Other speech and language deficits following cerebral infarction: Secondary | ICD-10-CM | POA: Diagnosis not present

## 2021-10-17 DIAGNOSIS — Z7982 Long term (current) use of aspirin: Secondary | ICD-10-CM | POA: Diagnosis not present

## 2021-10-17 DIAGNOSIS — Z792 Long term (current) use of antibiotics: Secondary | ICD-10-CM | POA: Diagnosis not present

## 2021-10-17 DIAGNOSIS — Z96652 Presence of left artificial knee joint: Secondary | ICD-10-CM | POA: Diagnosis not present

## 2021-10-17 DIAGNOSIS — G43001 Migraine without aura, not intractable, with status migrainosus: Secondary | ICD-10-CM | POA: Diagnosis not present

## 2021-10-17 DIAGNOSIS — Z7902 Long term (current) use of antithrombotics/antiplatelets: Secondary | ICD-10-CM | POA: Diagnosis not present

## 2021-10-17 DIAGNOSIS — R300 Dysuria: Secondary | ICD-10-CM | POA: Diagnosis not present

## 2021-10-17 DIAGNOSIS — I69351 Hemiplegia and hemiparesis following cerebral infarction affecting right dominant side: Secondary | ICD-10-CM | POA: Diagnosis not present

## 2021-10-17 DIAGNOSIS — Z87891 Personal history of nicotine dependence: Secondary | ICD-10-CM | POA: Diagnosis not present

## 2021-10-24 DIAGNOSIS — Z7982 Long term (current) use of aspirin: Secondary | ICD-10-CM | POA: Diagnosis not present

## 2021-10-24 DIAGNOSIS — Z96652 Presence of left artificial knee joint: Secondary | ICD-10-CM | POA: Diagnosis not present

## 2021-10-24 DIAGNOSIS — Z9181 History of falling: Secondary | ICD-10-CM | POA: Diagnosis not present

## 2021-10-24 DIAGNOSIS — I69328 Other speech and language deficits following cerebral infarction: Secondary | ICD-10-CM | POA: Diagnosis not present

## 2021-10-24 DIAGNOSIS — I6522 Occlusion and stenosis of left carotid artery: Secondary | ICD-10-CM | POA: Diagnosis not present

## 2021-10-24 DIAGNOSIS — Z792 Long term (current) use of antibiotics: Secondary | ICD-10-CM | POA: Diagnosis not present

## 2021-10-24 DIAGNOSIS — Z9049 Acquired absence of other specified parts of digestive tract: Secondary | ICD-10-CM | POA: Diagnosis not present

## 2021-10-24 DIAGNOSIS — N390 Urinary tract infection, site not specified: Secondary | ICD-10-CM | POA: Diagnosis not present

## 2021-10-24 DIAGNOSIS — Z7902 Long term (current) use of antithrombotics/antiplatelets: Secondary | ICD-10-CM | POA: Diagnosis not present

## 2021-10-24 DIAGNOSIS — Z87891 Personal history of nicotine dependence: Secondary | ICD-10-CM | POA: Diagnosis not present

## 2021-10-24 DIAGNOSIS — R300 Dysuria: Secondary | ICD-10-CM | POA: Diagnosis not present

## 2021-10-24 DIAGNOSIS — E7800 Pure hypercholesterolemia, unspecified: Secondary | ICD-10-CM | POA: Diagnosis not present

## 2021-10-24 DIAGNOSIS — G43001 Migraine without aura, not intractable, with status migrainosus: Secondary | ICD-10-CM | POA: Diagnosis not present

## 2021-10-24 DIAGNOSIS — I69351 Hemiplegia and hemiparesis following cerebral infarction affecting right dominant side: Secondary | ICD-10-CM | POA: Diagnosis not present

## 2021-11-04 DIAGNOSIS — M5416 Radiculopathy, lumbar region: Secondary | ICD-10-CM | POA: Diagnosis not present

## 2021-11-04 DIAGNOSIS — M545 Low back pain, unspecified: Secondary | ICD-10-CM | POA: Diagnosis not present

## 2021-11-04 DIAGNOSIS — G8929 Other chronic pain: Secondary | ICD-10-CM | POA: Diagnosis not present

## 2021-11-14 DIAGNOSIS — Z760 Encounter for issue of repeat prescription: Secondary | ICD-10-CM | POA: Diagnosis not present

## 2021-11-14 DIAGNOSIS — E039 Hypothyroidism, unspecified: Secondary | ICD-10-CM | POA: Diagnosis not present

## 2021-11-29 DIAGNOSIS — Z23 Encounter for immunization: Secondary | ICD-10-CM | POA: Diagnosis not present

## 2021-11-29 DIAGNOSIS — Z720 Tobacco use: Secondary | ICD-10-CM | POA: Diagnosis not present

## 2021-11-29 DIAGNOSIS — M109 Gout, unspecified: Secondary | ICD-10-CM | POA: Diagnosis not present

## 2021-12-10 DIAGNOSIS — M109 Gout, unspecified: Secondary | ICD-10-CM | POA: Diagnosis not present

## 2021-12-10 DIAGNOSIS — E876 Hypokalemia: Secondary | ICD-10-CM | POA: Diagnosis not present

## 2022-01-01 DIAGNOSIS — Z79899 Other long term (current) drug therapy: Secondary | ICD-10-CM | POA: Diagnosis not present

## 2022-01-01 DIAGNOSIS — M5416 Radiculopathy, lumbar region: Secondary | ICD-10-CM | POA: Diagnosis not present

## 2022-01-01 DIAGNOSIS — Z01812 Encounter for preprocedural laboratory examination: Secondary | ICD-10-CM | POA: Diagnosis not present

## 2022-01-01 DIAGNOSIS — I10 Essential (primary) hypertension: Secondary | ICD-10-CM | POA: Diagnosis not present

## 2022-01-03 DIAGNOSIS — E876 Hypokalemia: Secondary | ICD-10-CM | POA: Diagnosis not present

## 2022-01-20 DIAGNOSIS — G8929 Other chronic pain: Secondary | ICD-10-CM | POA: Diagnosis not present

## 2022-01-20 DIAGNOSIS — M545 Low back pain, unspecified: Secondary | ICD-10-CM | POA: Diagnosis not present

## 2022-01-20 DIAGNOSIS — M5416 Radiculopathy, lumbar region: Secondary | ICD-10-CM | POA: Diagnosis not present

## 2022-01-20 DIAGNOSIS — Z4542 Encounter for adjustment and management of neuropacemaker (brain) (peripheral nerve) (spinal cord): Secondary | ICD-10-CM | POA: Diagnosis not present

## 2022-01-29 DIAGNOSIS — M48062 Spinal stenosis, lumbar region with neurogenic claudication: Secondary | ICD-10-CM | POA: Diagnosis not present

## 2022-01-29 DIAGNOSIS — M5416 Radiculopathy, lumbar region: Secondary | ICD-10-CM | POA: Diagnosis not present

## 2022-01-29 DIAGNOSIS — Z4542 Encounter for adjustment and management of neuropacemaker (brain) (peripheral nerve) (spinal cord): Secondary | ICD-10-CM | POA: Diagnosis not present

## 2022-01-29 DIAGNOSIS — G894 Chronic pain syndrome: Secondary | ICD-10-CM | POA: Diagnosis not present

## 2022-03-19 DIAGNOSIS — Z01812 Encounter for preprocedural laboratory examination: Secondary | ICD-10-CM | POA: Diagnosis not present

## 2022-03-19 DIAGNOSIS — I10 Essential (primary) hypertension: Secondary | ICD-10-CM | POA: Diagnosis not present

## 2022-03-19 DIAGNOSIS — M5416 Radiculopathy, lumbar region: Secondary | ICD-10-CM | POA: Diagnosis not present

## 2022-03-19 DIAGNOSIS — E039 Hypothyroidism, unspecified: Secondary | ICD-10-CM | POA: Diagnosis not present

## 2022-03-31 DIAGNOSIS — I10 Essential (primary) hypertension: Secondary | ICD-10-CM | POA: Diagnosis not present

## 2022-03-31 DIAGNOSIS — Z79899 Other long term (current) drug therapy: Secondary | ICD-10-CM | POA: Diagnosis not present

## 2022-03-31 DIAGNOSIS — M5416 Radiculopathy, lumbar region: Secondary | ICD-10-CM | POA: Diagnosis not present

## 2022-03-31 DIAGNOSIS — R7303 Prediabetes: Secondary | ICD-10-CM | POA: Diagnosis not present

## 2022-03-31 DIAGNOSIS — Z9689 Presence of other specified functional implants: Secondary | ICD-10-CM | POA: Diagnosis not present

## 2022-03-31 DIAGNOSIS — E876 Hypokalemia: Secondary | ICD-10-CM | POA: Diagnosis not present

## 2022-03-31 DIAGNOSIS — E871 Hypo-osmolality and hyponatremia: Secondary | ICD-10-CM | POA: Diagnosis not present

## 2022-03-31 DIAGNOSIS — I6522 Occlusion and stenosis of left carotid artery: Secondary | ICD-10-CM | POA: Diagnosis not present

## 2022-03-31 DIAGNOSIS — Z7982 Long term (current) use of aspirin: Secondary | ICD-10-CM | POA: Diagnosis not present

## 2022-03-31 DIAGNOSIS — E785 Hyperlipidemia, unspecified: Secondary | ICD-10-CM | POA: Diagnosis not present

## 2022-03-31 DIAGNOSIS — D649 Anemia, unspecified: Secondary | ICD-10-CM | POA: Diagnosis not present

## 2022-03-31 DIAGNOSIS — F17210 Nicotine dependence, cigarettes, uncomplicated: Secondary | ICD-10-CM | POA: Diagnosis not present

## 2022-03-31 DIAGNOSIS — I110 Hypertensive heart disease with heart failure: Secondary | ICD-10-CM | POA: Diagnosis not present

## 2022-03-31 DIAGNOSIS — I5030 Unspecified diastolic (congestive) heart failure: Secondary | ICD-10-CM | POA: Diagnosis not present

## 2022-03-31 DIAGNOSIS — N289 Disorder of kidney and ureter, unspecified: Secondary | ICD-10-CM | POA: Diagnosis not present

## 2022-03-31 DIAGNOSIS — Z7902 Long term (current) use of antithrombotics/antiplatelets: Secondary | ICD-10-CM | POA: Diagnosis not present

## 2022-03-31 DIAGNOSIS — R001 Bradycardia, unspecified: Secondary | ICD-10-CM | POA: Diagnosis not present

## 2022-03-31 DIAGNOSIS — F32A Depression, unspecified: Secondary | ICD-10-CM | POA: Diagnosis not present

## 2022-03-31 DIAGNOSIS — Z8673 Personal history of transient ischemic attack (TIA), and cerebral infarction without residual deficits: Secondary | ICD-10-CM | POA: Diagnosis not present

## 2022-03-31 DIAGNOSIS — G2581 Restless legs syndrome: Secondary | ICD-10-CM | POA: Diagnosis not present

## 2022-03-31 DIAGNOSIS — M109 Gout, unspecified: Secondary | ICD-10-CM | POA: Diagnosis not present

## 2022-04-09 DIAGNOSIS — Z9689 Presence of other specified functional implants: Secondary | ICD-10-CM | POA: Diagnosis not present

## 2022-04-09 DIAGNOSIS — M545 Low back pain, unspecified: Secondary | ICD-10-CM | POA: Diagnosis not present

## 2022-04-09 DIAGNOSIS — Z9682 Presence of neurostimulator: Secondary | ICD-10-CM | POA: Diagnosis not present

## 2022-04-09 DIAGNOSIS — F17210 Nicotine dependence, cigarettes, uncomplicated: Secondary | ICD-10-CM | POA: Diagnosis not present

## 2022-04-24 DIAGNOSIS — G8929 Other chronic pain: Secondary | ICD-10-CM | POA: Diagnosis not present

## 2022-04-24 DIAGNOSIS — F17210 Nicotine dependence, cigarettes, uncomplicated: Secondary | ICD-10-CM | POA: Diagnosis not present

## 2022-04-24 DIAGNOSIS — Z9689 Presence of other specified functional implants: Secondary | ICD-10-CM | POA: Diagnosis not present

## 2022-04-24 DIAGNOSIS — M5441 Lumbago with sciatica, right side: Secondary | ICD-10-CM | POA: Diagnosis not present

## 2022-04-24 DIAGNOSIS — Z9682 Presence of neurostimulator: Secondary | ICD-10-CM | POA: Diagnosis not present

## 2022-05-05 DIAGNOSIS — I10 Essential (primary) hypertension: Secondary | ICD-10-CM | POA: Diagnosis not present

## 2022-05-07 DIAGNOSIS — E875 Hyperkalemia: Secondary | ICD-10-CM | POA: Diagnosis not present

## 2022-05-18 DIAGNOSIS — L259 Unspecified contact dermatitis, unspecified cause: Secondary | ICD-10-CM | POA: Diagnosis not present

## 2022-05-21 DIAGNOSIS — J309 Allergic rhinitis, unspecified: Secondary | ICD-10-CM | POA: Diagnosis not present

## 2022-05-21 DIAGNOSIS — G43001 Migraine without aura, not intractable, with status migrainosus: Secondary | ICD-10-CM | POA: Diagnosis not present

## 2022-05-21 DIAGNOSIS — E7800 Pure hypercholesterolemia, unspecified: Secondary | ICD-10-CM | POA: Diagnosis not present

## 2022-05-21 DIAGNOSIS — J449 Chronic obstructive pulmonary disease, unspecified: Secondary | ICD-10-CM | POA: Diagnosis not present

## 2022-05-21 DIAGNOSIS — E039 Hypothyroidism, unspecified: Secondary | ICD-10-CM | POA: Diagnosis not present

## 2022-06-23 DIAGNOSIS — R0600 Dyspnea, unspecified: Secondary | ICD-10-CM | POA: Diagnosis not present

## 2022-06-23 DIAGNOSIS — R0602 Shortness of breath: Secondary | ICD-10-CM | POA: Diagnosis not present

## 2022-07-03 DIAGNOSIS — I509 Heart failure, unspecified: Secondary | ICD-10-CM | POA: Diagnosis not present

## 2022-07-03 DIAGNOSIS — E039 Hypothyroidism, unspecified: Secondary | ICD-10-CM | POA: Diagnosis not present

## 2022-07-03 DIAGNOSIS — R252 Cramp and spasm: Secondary | ICD-10-CM | POA: Diagnosis not present

## 2022-09-02 DIAGNOSIS — R6521 Severe sepsis with septic shock: Secondary | ICD-10-CM | POA: Diagnosis not present

## 2022-09-02 DIAGNOSIS — I708 Atherosclerosis of other arteries: Secondary | ICD-10-CM | POA: Diagnosis not present

## 2022-09-02 DIAGNOSIS — Z7902 Long term (current) use of antithrombotics/antiplatelets: Secondary | ICD-10-CM | POA: Diagnosis not present

## 2022-09-02 DIAGNOSIS — A419 Sepsis, unspecified organism: Secondary | ICD-10-CM | POA: Diagnosis not present

## 2022-09-02 DIAGNOSIS — Z7982 Long term (current) use of aspirin: Secondary | ICD-10-CM | POA: Diagnosis not present

## 2022-09-02 DIAGNOSIS — K529 Noninfective gastroenteritis and colitis, unspecified: Secondary | ICD-10-CM | POA: Diagnosis not present

## 2022-09-02 DIAGNOSIS — A09 Infectious gastroenteritis and colitis, unspecified: Secondary | ICD-10-CM | POA: Diagnosis not present

## 2022-09-02 DIAGNOSIS — I774 Celiac artery compression syndrome: Secondary | ICD-10-CM | POA: Diagnosis not present

## 2022-09-02 DIAGNOSIS — Z8673 Personal history of transient ischemic attack (TIA), and cerebral infarction without residual deficits: Secondary | ICD-10-CM | POA: Diagnosis not present

## 2022-09-02 DIAGNOSIS — Z96652 Presence of left artificial knee joint: Secondary | ICD-10-CM | POA: Diagnosis not present

## 2022-09-02 DIAGNOSIS — E039 Hypothyroidism, unspecified: Secondary | ICD-10-CM | POA: Diagnosis not present

## 2022-09-02 DIAGNOSIS — A048 Other specified bacterial intestinal infections: Secondary | ICD-10-CM | POA: Diagnosis not present

## 2022-09-02 DIAGNOSIS — R079 Chest pain, unspecified: Secondary | ICD-10-CM | POA: Diagnosis not present

## 2022-09-02 DIAGNOSIS — R1084 Generalized abdominal pain: Secondary | ICD-10-CM | POA: Diagnosis not present

## 2022-09-02 DIAGNOSIS — E1151 Type 2 diabetes mellitus with diabetic peripheral angiopathy without gangrene: Secondary | ICD-10-CM | POA: Diagnosis not present

## 2022-09-02 DIAGNOSIS — R188 Other ascites: Secondary | ICD-10-CM | POA: Diagnosis not present

## 2022-09-02 DIAGNOSIS — I69331 Monoplegia of upper limb following cerebral infarction affecting right dominant side: Secondary | ICD-10-CM | POA: Diagnosis not present

## 2022-09-02 DIAGNOSIS — M5441 Lumbago with sciatica, right side: Secondary | ICD-10-CM | POA: Diagnosis not present

## 2022-09-02 DIAGNOSIS — R579 Shock, unspecified: Secondary | ICD-10-CM | POA: Diagnosis not present

## 2022-09-02 DIAGNOSIS — D649 Anemia, unspecified: Secondary | ICD-10-CM | POA: Diagnosis not present

## 2022-09-02 DIAGNOSIS — E872 Acidosis, unspecified: Secondary | ICD-10-CM | POA: Diagnosis not present

## 2022-09-02 DIAGNOSIS — E785 Hyperlipidemia, unspecified: Secondary | ICD-10-CM | POA: Diagnosis not present

## 2022-09-02 DIAGNOSIS — I739 Peripheral vascular disease, unspecified: Secondary | ICD-10-CM | POA: Diagnosis not present

## 2022-09-02 DIAGNOSIS — M545 Low back pain, unspecified: Secondary | ICD-10-CM | POA: Diagnosis not present

## 2022-09-02 DIAGNOSIS — G8929 Other chronic pain: Secondary | ICD-10-CM | POA: Diagnosis not present

## 2022-09-02 DIAGNOSIS — I6522 Occlusion and stenosis of left carotid artery: Secondary | ICD-10-CM | POA: Diagnosis not present

## 2022-09-02 DIAGNOSIS — F17200 Nicotine dependence, unspecified, uncomplicated: Secondary | ICD-10-CM | POA: Diagnosis not present

## 2022-09-02 DIAGNOSIS — N179 Acute kidney failure, unspecified: Secondary | ICD-10-CM | POA: Diagnosis not present

## 2022-09-02 DIAGNOSIS — I5032 Chronic diastolic (congestive) heart failure: Secondary | ICD-10-CM | POA: Diagnosis not present

## 2022-09-02 DIAGNOSIS — K559 Vascular disorder of intestine, unspecified: Secondary | ICD-10-CM | POA: Diagnosis not present

## 2022-09-02 DIAGNOSIS — R0602 Shortness of breath: Secondary | ICD-10-CM | POA: Diagnosis not present

## 2022-09-02 DIAGNOSIS — R109 Unspecified abdominal pain: Secondary | ICD-10-CM | POA: Diagnosis not present

## 2022-09-02 DIAGNOSIS — E875 Hyperkalemia: Secondary | ICD-10-CM | POA: Diagnosis not present

## 2022-09-02 DIAGNOSIS — F17210 Nicotine dependence, cigarettes, uncomplicated: Secondary | ICD-10-CM | POA: Diagnosis not present

## 2022-09-02 DIAGNOSIS — I110 Hypertensive heart disease with heart failure: Secondary | ICD-10-CM | POA: Diagnosis not present

## 2022-09-02 DIAGNOSIS — Z9049 Acquired absence of other specified parts of digestive tract: Secondary | ICD-10-CM | POA: Diagnosis not present

## 2022-09-02 DIAGNOSIS — I5030 Unspecified diastolic (congestive) heart failure: Secondary | ICD-10-CM | POA: Diagnosis not present

## 2022-09-02 DIAGNOSIS — N289 Disorder of kidney and ureter, unspecified: Secondary | ICD-10-CM | POA: Diagnosis not present

## 2022-09-02 DIAGNOSIS — I959 Hypotension, unspecified: Secondary | ICD-10-CM | POA: Diagnosis not present

## 2022-09-03 DIAGNOSIS — I110 Hypertensive heart disease with heart failure: Secondary | ICD-10-CM | POA: Diagnosis not present

## 2022-09-03 DIAGNOSIS — K559 Vascular disorder of intestine, unspecified: Secondary | ICD-10-CM | POA: Diagnosis not present

## 2022-09-03 DIAGNOSIS — D649 Anemia, unspecified: Secondary | ICD-10-CM | POA: Diagnosis not present

## 2022-09-03 DIAGNOSIS — G8929 Other chronic pain: Secondary | ICD-10-CM | POA: Diagnosis not present

## 2022-09-03 DIAGNOSIS — A048 Other specified bacterial intestinal infections: Secondary | ICD-10-CM | POA: Diagnosis not present

## 2022-09-03 DIAGNOSIS — R579 Shock, unspecified: Secondary | ICD-10-CM | POA: Diagnosis not present

## 2022-09-03 DIAGNOSIS — N179 Acute kidney failure, unspecified: Secondary | ICD-10-CM | POA: Diagnosis not present

## 2022-09-03 DIAGNOSIS — I5030 Unspecified diastolic (congestive) heart failure: Secondary | ICD-10-CM | POA: Diagnosis not present

## 2022-09-03 DIAGNOSIS — E872 Acidosis, unspecified: Secondary | ICD-10-CM | POA: Diagnosis not present

## 2022-09-03 DIAGNOSIS — R6521 Severe sepsis with septic shock: Secondary | ICD-10-CM | POA: Diagnosis not present

## 2022-09-03 DIAGNOSIS — I739 Peripheral vascular disease, unspecified: Secondary | ICD-10-CM | POA: Diagnosis not present

## 2022-09-03 DIAGNOSIS — E875 Hyperkalemia: Secondary | ICD-10-CM | POA: Diagnosis not present

## 2022-09-03 DIAGNOSIS — M545 Low back pain, unspecified: Secondary | ICD-10-CM | POA: Diagnosis not present

## 2022-09-03 DIAGNOSIS — A419 Sepsis, unspecified organism: Secondary | ICD-10-CM | POA: Diagnosis not present

## 2022-09-04 DIAGNOSIS — R079 Chest pain, unspecified: Secondary | ICD-10-CM | POA: Diagnosis not present

## 2022-09-04 DIAGNOSIS — E875 Hyperkalemia: Secondary | ICD-10-CM | POA: Diagnosis not present

## 2022-09-04 DIAGNOSIS — I739 Peripheral vascular disease, unspecified: Secondary | ICD-10-CM | POA: Diagnosis not present

## 2022-09-04 DIAGNOSIS — N179 Acute kidney failure, unspecified: Secondary | ICD-10-CM | POA: Diagnosis not present

## 2022-09-04 DIAGNOSIS — I110 Hypertensive heart disease with heart failure: Secondary | ICD-10-CM | POA: Diagnosis not present

## 2022-09-04 DIAGNOSIS — A048 Other specified bacterial intestinal infections: Secondary | ICD-10-CM | POA: Diagnosis not present

## 2022-09-04 DIAGNOSIS — G8929 Other chronic pain: Secondary | ICD-10-CM | POA: Diagnosis not present

## 2022-09-04 DIAGNOSIS — A419 Sepsis, unspecified organism: Secondary | ICD-10-CM | POA: Diagnosis not present

## 2022-09-04 DIAGNOSIS — I5030 Unspecified diastolic (congestive) heart failure: Secondary | ICD-10-CM | POA: Diagnosis not present

## 2022-09-04 DIAGNOSIS — D649 Anemia, unspecified: Secondary | ICD-10-CM | POA: Diagnosis not present

## 2022-09-04 DIAGNOSIS — E872 Acidosis, unspecified: Secondary | ICD-10-CM | POA: Diagnosis not present

## 2022-09-04 DIAGNOSIS — R6521 Severe sepsis with septic shock: Secondary | ICD-10-CM | POA: Diagnosis not present

## 2022-09-04 DIAGNOSIS — M545 Low back pain, unspecified: Secondary | ICD-10-CM | POA: Diagnosis not present

## 2022-09-05 DIAGNOSIS — I110 Hypertensive heart disease with heart failure: Secondary | ICD-10-CM | POA: Diagnosis not present

## 2022-09-05 DIAGNOSIS — M545 Low back pain, unspecified: Secondary | ICD-10-CM | POA: Diagnosis not present

## 2022-09-05 DIAGNOSIS — A048 Other specified bacterial intestinal infections: Secondary | ICD-10-CM | POA: Diagnosis not present

## 2022-09-05 DIAGNOSIS — A419 Sepsis, unspecified organism: Secondary | ICD-10-CM | POA: Diagnosis not present

## 2022-09-05 DIAGNOSIS — G8929 Other chronic pain: Secondary | ICD-10-CM | POA: Diagnosis not present

## 2022-09-05 DIAGNOSIS — D649 Anemia, unspecified: Secondary | ICD-10-CM | POA: Diagnosis not present

## 2022-09-05 DIAGNOSIS — I739 Peripheral vascular disease, unspecified: Secondary | ICD-10-CM | POA: Diagnosis not present

## 2022-09-05 DIAGNOSIS — E039 Hypothyroidism, unspecified: Secondary | ICD-10-CM | POA: Diagnosis not present

## 2022-09-05 DIAGNOSIS — Z8673 Personal history of transient ischemic attack (TIA), and cerebral infarction without residual deficits: Secondary | ICD-10-CM | POA: Diagnosis not present

## 2022-09-05 DIAGNOSIS — R6521 Severe sepsis with septic shock: Secondary | ICD-10-CM | POA: Diagnosis not present

## 2022-09-05 DIAGNOSIS — I5030 Unspecified diastolic (congestive) heart failure: Secondary | ICD-10-CM | POA: Diagnosis not present

## 2022-09-05 DIAGNOSIS — N179 Acute kidney failure, unspecified: Secondary | ICD-10-CM | POA: Diagnosis not present

## 2022-09-22 DIAGNOSIS — I10 Essential (primary) hypertension: Secondary | ICD-10-CM | POA: Diagnosis not present

## 2022-09-22 DIAGNOSIS — E039 Hypothyroidism, unspecified: Secondary | ICD-10-CM | POA: Diagnosis not present

## 2022-09-25 DIAGNOSIS — I10 Essential (primary) hypertension: Secondary | ICD-10-CM | POA: Diagnosis not present

## 2022-09-25 DIAGNOSIS — Z0000 Encounter for general adult medical examination without abnormal findings: Secondary | ICD-10-CM | POA: Diagnosis not present

## 2022-09-25 DIAGNOSIS — R252 Cramp and spasm: Secondary | ICD-10-CM | POA: Diagnosis not present

## 2022-09-26 DIAGNOSIS — R079 Chest pain, unspecified: Secondary | ICD-10-CM | POA: Diagnosis not present

## 2022-10-14 DIAGNOSIS — M79652 Pain in left thigh: Secondary | ICD-10-CM | POA: Diagnosis not present

## 2022-10-20 DIAGNOSIS — M7072 Other bursitis of hip, left hip: Secondary | ICD-10-CM | POA: Diagnosis not present

## 2022-11-14 DIAGNOSIS — M7072 Other bursitis of hip, left hip: Secondary | ICD-10-CM | POA: Diagnosis not present

## 2022-11-17 DIAGNOSIS — M25562 Pain in left knee: Secondary | ICD-10-CM | POA: Diagnosis not present

## 2022-11-17 DIAGNOSIS — M79652 Pain in left thigh: Secondary | ICD-10-CM | POA: Diagnosis not present

## 2022-11-17 DIAGNOSIS — M7072 Other bursitis of hip, left hip: Secondary | ICD-10-CM | POA: Diagnosis not present

## 2022-12-05 DIAGNOSIS — M5432 Sciatica, left side: Secondary | ICD-10-CM | POA: Diagnosis not present

## 2022-12-05 DIAGNOSIS — M4186 Other forms of scoliosis, lumbar region: Secondary | ICD-10-CM | POA: Diagnosis not present

## 2022-12-05 DIAGNOSIS — M51369 Other intervertebral disc degeneration, lumbar region without mention of lumbar back pain or lower extremity pain: Secondary | ICD-10-CM | POA: Diagnosis not present

## 2022-12-05 DIAGNOSIS — M25552 Pain in left hip: Secondary | ICD-10-CM | POA: Diagnosis not present

## 2022-12-17 DIAGNOSIS — J441 Chronic obstructive pulmonary disease with (acute) exacerbation: Secondary | ICD-10-CM | POA: Diagnosis not present

## 2022-12-22 ENCOUNTER — Encounter (HOSPITAL_COMMUNITY): Payer: Self-pay

## 2022-12-22 DIAGNOSIS — I6522 Occlusion and stenosis of left carotid artery: Secondary | ICD-10-CM | POA: Diagnosis not present

## 2022-12-22 DIAGNOSIS — I10 Essential (primary) hypertension: Secondary | ICD-10-CM | POA: Diagnosis not present

## 2022-12-22 DIAGNOSIS — D649 Anemia, unspecified: Secondary | ICD-10-CM | POA: Diagnosis not present

## 2022-12-22 DIAGNOSIS — R531 Weakness: Secondary | ICD-10-CM | POA: Diagnosis not present

## 2022-12-22 DIAGNOSIS — D509 Iron deficiency anemia, unspecified: Secondary | ICD-10-CM | POA: Diagnosis not present

## 2022-12-22 DIAGNOSIS — R0602 Shortness of breath: Secondary | ICD-10-CM | POA: Diagnosis not present

## 2022-12-22 DIAGNOSIS — I5032 Chronic diastolic (congestive) heart failure: Secondary | ICD-10-CM | POA: Diagnosis not present

## 2022-12-23 DIAGNOSIS — D509 Iron deficiency anemia, unspecified: Secondary | ICD-10-CM | POA: Diagnosis not present

## 2022-12-23 DIAGNOSIS — K5730 Diverticulosis of large intestine without perforation or abscess without bleeding: Secondary | ICD-10-CM | POA: Diagnosis not present

## 2022-12-23 DIAGNOSIS — D122 Benign neoplasm of ascending colon: Secondary | ICD-10-CM | POA: Diagnosis not present

## 2022-12-23 DIAGNOSIS — K3189 Other diseases of stomach and duodenum: Secondary | ICD-10-CM | POA: Diagnosis not present

## 2022-12-23 DIAGNOSIS — E039 Hypothyroidism, unspecified: Secondary | ICD-10-CM | POA: Diagnosis not present

## 2022-12-23 DIAGNOSIS — I6522 Occlusion and stenosis of left carotid artery: Secondary | ICD-10-CM | POA: Diagnosis not present

## 2022-12-23 DIAGNOSIS — Z888 Allergy status to other drugs, medicaments and biological substances status: Secondary | ICD-10-CM | POA: Diagnosis not present

## 2022-12-23 DIAGNOSIS — Z7902 Long term (current) use of antithrombotics/antiplatelets: Secondary | ICD-10-CM | POA: Diagnosis not present

## 2022-12-23 DIAGNOSIS — Z791 Long term (current) use of non-steroidal anti-inflammatories (NSAID): Secondary | ICD-10-CM | POA: Diagnosis not present

## 2022-12-23 DIAGNOSIS — I5032 Chronic diastolic (congestive) heart failure: Secondary | ICD-10-CM | POA: Diagnosis not present

## 2022-12-23 DIAGNOSIS — Z885 Allergy status to narcotic agent status: Secondary | ICD-10-CM | POA: Diagnosis not present

## 2022-12-23 DIAGNOSIS — F17210 Nicotine dependence, cigarettes, uncomplicated: Secondary | ICD-10-CM | POA: Diagnosis not present

## 2022-12-23 DIAGNOSIS — R0602 Shortness of breath: Secondary | ICD-10-CM | POA: Diagnosis not present

## 2022-12-23 DIAGNOSIS — Z1152 Encounter for screening for COVID-19: Secondary | ICD-10-CM | POA: Diagnosis not present

## 2022-12-23 DIAGNOSIS — M1990 Unspecified osteoarthritis, unspecified site: Secondary | ICD-10-CM | POA: Diagnosis not present

## 2022-12-23 DIAGNOSIS — K635 Polyp of colon: Secondary | ICD-10-CM | POA: Diagnosis not present

## 2022-12-23 DIAGNOSIS — R531 Weakness: Secondary | ICD-10-CM | POA: Diagnosis not present

## 2022-12-23 DIAGNOSIS — Z8673 Personal history of transient ischemic attack (TIA), and cerebral infarction without residual deficits: Secondary | ICD-10-CM | POA: Diagnosis not present

## 2022-12-23 DIAGNOSIS — D126 Benign neoplasm of colon, unspecified: Secondary | ICD-10-CM | POA: Diagnosis not present

## 2022-12-23 DIAGNOSIS — J449 Chronic obstructive pulmonary disease, unspecified: Secondary | ICD-10-CM | POA: Diagnosis not present

## 2022-12-23 DIAGNOSIS — I10 Essential (primary) hypertension: Secondary | ICD-10-CM | POA: Diagnosis not present

## 2022-12-23 DIAGNOSIS — E7800 Pure hypercholesterolemia, unspecified: Secondary | ICD-10-CM | POA: Diagnosis not present

## 2022-12-23 DIAGNOSIS — K648 Other hemorrhoids: Secondary | ICD-10-CM | POA: Diagnosis not present

## 2022-12-23 DIAGNOSIS — Z7982 Long term (current) use of aspirin: Secondary | ICD-10-CM | POA: Diagnosis not present

## 2022-12-23 DIAGNOSIS — I110 Hypertensive heart disease with heart failure: Secondary | ICD-10-CM | POA: Diagnosis not present

## 2022-12-23 DIAGNOSIS — K222 Esophageal obstruction: Secondary | ICD-10-CM | POA: Diagnosis not present

## 2022-12-23 DIAGNOSIS — D649 Anemia, unspecified: Secondary | ICD-10-CM | POA: Diagnosis not present

## 2022-12-23 DIAGNOSIS — D519 Vitamin B12 deficiency anemia, unspecified: Secondary | ICD-10-CM | POA: Diagnosis not present

## 2022-12-23 DIAGNOSIS — D124 Benign neoplasm of descending colon: Secondary | ICD-10-CM | POA: Diagnosis not present

## 2022-12-23 DIAGNOSIS — T39395A Adverse effect of other nonsteroidal anti-inflammatory drugs [NSAID], initial encounter: Secondary | ICD-10-CM | POA: Diagnosis not present

## 2022-12-23 DIAGNOSIS — Z79899 Other long term (current) drug therapy: Secondary | ICD-10-CM | POA: Diagnosis not present

## 2022-12-23 DIAGNOSIS — K314 Gastric diverticulum: Secondary | ICD-10-CM | POA: Diagnosis not present

## 2022-12-24 DIAGNOSIS — I10 Essential (primary) hypertension: Secondary | ICD-10-CM | POA: Diagnosis not present

## 2022-12-24 DIAGNOSIS — K222 Esophageal obstruction: Secondary | ICD-10-CM | POA: Diagnosis not present

## 2022-12-24 DIAGNOSIS — D509 Iron deficiency anemia, unspecified: Secondary | ICD-10-CM | POA: Diagnosis not present

## 2022-12-25 LAB — HM COLONOSCOPY

## 2023-01-30 DIAGNOSIS — I10 Essential (primary) hypertension: Secondary | ICD-10-CM | POA: Diagnosis not present

## 2023-01-30 DIAGNOSIS — M109 Gout, unspecified: Secondary | ICD-10-CM | POA: Diagnosis not present

## 2023-01-30 DIAGNOSIS — Z720 Tobacco use: Secondary | ICD-10-CM | POA: Diagnosis not present

## 2023-01-30 DIAGNOSIS — E039 Hypothyroidism, unspecified: Secondary | ICD-10-CM | POA: Diagnosis not present

## 2023-01-30 DIAGNOSIS — G43001 Migraine without aura, not intractable, with status migrainosus: Secondary | ICD-10-CM | POA: Diagnosis not present

## 2023-04-19 DIAGNOSIS — R9431 Abnormal electrocardiogram [ECG] [EKG]: Secondary | ICD-10-CM | POA: Diagnosis not present

## 2023-04-19 DIAGNOSIS — D509 Iron deficiency anemia, unspecified: Secondary | ICD-10-CM | POA: Diagnosis not present

## 2023-04-19 DIAGNOSIS — J810 Acute pulmonary edema: Secondary | ICD-10-CM | POA: Diagnosis not present

## 2023-04-19 DIAGNOSIS — Z96652 Presence of left artificial knee joint: Secondary | ICD-10-CM | POA: Diagnosis not present

## 2023-04-19 DIAGNOSIS — Z8673 Personal history of transient ischemic attack (TIA), and cerebral infarction without residual deficits: Secondary | ICD-10-CM | POA: Diagnosis not present

## 2023-04-19 DIAGNOSIS — Z20822 Contact with and (suspected) exposure to covid-19: Secondary | ICD-10-CM | POA: Diagnosis not present

## 2023-04-19 DIAGNOSIS — I351 Nonrheumatic aortic (valve) insufficiency: Secondary | ICD-10-CM | POA: Diagnosis not present

## 2023-04-19 DIAGNOSIS — F17200 Nicotine dependence, unspecified, uncomplicated: Secondary | ICD-10-CM | POA: Diagnosis not present

## 2023-04-19 DIAGNOSIS — I10 Essential (primary) hypertension: Secondary | ICD-10-CM | POA: Diagnosis not present

## 2023-04-19 DIAGNOSIS — J80 Acute respiratory distress syndrome: Secondary | ICD-10-CM | POA: Diagnosis not present

## 2023-04-19 DIAGNOSIS — R0602 Shortness of breath: Secondary | ICD-10-CM | POA: Diagnosis not present

## 2023-04-19 DIAGNOSIS — E871 Hypo-osmolality and hyponatremia: Secondary | ICD-10-CM | POA: Diagnosis not present

## 2023-04-19 DIAGNOSIS — J9601 Acute respiratory failure with hypoxia: Secondary | ICD-10-CM | POA: Diagnosis not present

## 2023-04-19 DIAGNOSIS — E039 Hypothyroidism, unspecified: Secondary | ICD-10-CM | POA: Diagnosis not present

## 2023-04-19 DIAGNOSIS — M1990 Unspecified osteoarthritis, unspecified site: Secondary | ICD-10-CM | POA: Diagnosis not present

## 2023-04-19 DIAGNOSIS — Z79899 Other long term (current) drug therapy: Secondary | ICD-10-CM | POA: Diagnosis not present

## 2023-04-19 DIAGNOSIS — Z7982 Long term (current) use of aspirin: Secondary | ICD-10-CM | POA: Diagnosis not present

## 2023-04-19 DIAGNOSIS — J441 Chronic obstructive pulmonary disease with (acute) exacerbation: Secondary | ICD-10-CM | POA: Diagnosis not present

## 2023-04-19 DIAGNOSIS — Z7902 Long term (current) use of antithrombotics/antiplatelets: Secondary | ICD-10-CM | POA: Diagnosis not present

## 2023-05-06 DIAGNOSIS — E039 Hypothyroidism, unspecified: Secondary | ICD-10-CM | POA: Diagnosis not present

## 2023-05-06 DIAGNOSIS — J441 Chronic obstructive pulmonary disease with (acute) exacerbation: Secondary | ICD-10-CM | POA: Diagnosis not present

## 2023-05-06 DIAGNOSIS — I10 Essential (primary) hypertension: Secondary | ICD-10-CM | POA: Diagnosis not present

## 2023-05-20 DIAGNOSIS — D509 Iron deficiency anemia, unspecified: Secondary | ICD-10-CM | POA: Diagnosis not present

## 2023-05-20 DIAGNOSIS — G629 Polyneuropathy, unspecified: Secondary | ICD-10-CM | POA: Diagnosis not present

## 2023-05-20 DIAGNOSIS — M1990 Unspecified osteoarthritis, unspecified site: Secondary | ICD-10-CM | POA: Diagnosis not present

## 2023-05-20 DIAGNOSIS — Z79899 Other long term (current) drug therapy: Secondary | ICD-10-CM | POA: Diagnosis not present

## 2023-05-20 DIAGNOSIS — Z7982 Long term (current) use of aspirin: Secondary | ICD-10-CM | POA: Diagnosis not present

## 2023-05-20 DIAGNOSIS — I110 Hypertensive heart disease with heart failure: Secondary | ICD-10-CM | POA: Diagnosis not present

## 2023-05-20 DIAGNOSIS — I10 Essential (primary) hypertension: Secondary | ICD-10-CM | POA: Diagnosis not present

## 2023-05-20 DIAGNOSIS — E7800 Pure hypercholesterolemia, unspecified: Secondary | ICD-10-CM | POA: Diagnosis not present

## 2023-05-20 DIAGNOSIS — Z7902 Long term (current) use of antithrombotics/antiplatelets: Secondary | ICD-10-CM | POA: Diagnosis not present

## 2023-05-20 DIAGNOSIS — Z881 Allergy status to other antibiotic agents status: Secondary | ICD-10-CM | POA: Diagnosis not present

## 2023-05-20 DIAGNOSIS — Z885 Allergy status to narcotic agent status: Secondary | ICD-10-CM | POA: Diagnosis not present

## 2023-05-20 DIAGNOSIS — J449 Chronic obstructive pulmonary disease, unspecified: Secondary | ICD-10-CM | POA: Diagnosis not present

## 2023-05-20 DIAGNOSIS — I5033 Acute on chronic diastolic (congestive) heart failure: Secondary | ICD-10-CM | POA: Diagnosis not present

## 2023-05-20 DIAGNOSIS — I69351 Hemiplegia and hemiparesis following cerebral infarction affecting right dominant side: Secondary | ICD-10-CM | POA: Diagnosis not present

## 2023-05-20 DIAGNOSIS — R0602 Shortness of breath: Secondary | ICD-10-CM | POA: Diagnosis not present

## 2023-05-20 DIAGNOSIS — I509 Heart failure, unspecified: Secondary | ICD-10-CM | POA: Diagnosis not present

## 2023-05-20 DIAGNOSIS — D649 Anemia, unspecified: Secondary | ICD-10-CM | POA: Diagnosis not present

## 2023-05-20 DIAGNOSIS — R0609 Other forms of dyspnea: Secondary | ICD-10-CM | POA: Diagnosis not present

## 2023-05-20 DIAGNOSIS — D62 Acute posthemorrhagic anemia: Secondary | ICD-10-CM | POA: Diagnosis not present

## 2023-05-20 DIAGNOSIS — E039 Hypothyroidism, unspecified: Secondary | ICD-10-CM | POA: Diagnosis not present

## 2023-05-20 DIAGNOSIS — M7989 Other specified soft tissue disorders: Secondary | ICD-10-CM | POA: Diagnosis not present

## 2023-05-20 DIAGNOSIS — F17210 Nicotine dependence, cigarettes, uncomplicated: Secondary | ICD-10-CM | POA: Diagnosis not present

## 2023-05-21 DIAGNOSIS — R0609 Other forms of dyspnea: Secondary | ICD-10-CM | POA: Diagnosis not present

## 2023-05-29 DIAGNOSIS — I10 Essential (primary) hypertension: Secondary | ICD-10-CM | POA: Diagnosis not present

## 2023-06-01 DIAGNOSIS — D649 Anemia, unspecified: Secondary | ICD-10-CM | POA: Diagnosis not present

## 2023-06-02 ENCOUNTER — Encounter: Payer: Self-pay | Admitting: Hematology and Oncology

## 2023-06-03 DIAGNOSIS — Z7902 Long term (current) use of antithrombotics/antiplatelets: Secondary | ICD-10-CM | POA: Diagnosis not present

## 2023-06-03 DIAGNOSIS — D500 Iron deficiency anemia secondary to blood loss (chronic): Secondary | ICD-10-CM | POA: Diagnosis not present

## 2023-06-03 DIAGNOSIS — M1990 Unspecified osteoarthritis, unspecified site: Secondary | ICD-10-CM | POA: Diagnosis not present

## 2023-06-03 DIAGNOSIS — Z79899 Other long term (current) drug therapy: Secondary | ICD-10-CM | POA: Diagnosis not present

## 2023-06-03 DIAGNOSIS — E039 Hypothyroidism, unspecified: Secondary | ICD-10-CM | POA: Diagnosis not present

## 2023-06-03 DIAGNOSIS — M109 Gout, unspecified: Secondary | ICD-10-CM | POA: Diagnosis not present

## 2023-06-03 DIAGNOSIS — R748 Abnormal levels of other serum enzymes: Secondary | ICD-10-CM | POA: Diagnosis not present

## 2023-06-03 DIAGNOSIS — Z885 Allergy status to narcotic agent status: Secondary | ICD-10-CM | POA: Diagnosis not present

## 2023-06-03 DIAGNOSIS — J9811 Atelectasis: Secondary | ICD-10-CM | POA: Diagnosis not present

## 2023-06-03 DIAGNOSIS — F17210 Nicotine dependence, cigarettes, uncomplicated: Secondary | ICD-10-CM | POA: Diagnosis not present

## 2023-06-03 DIAGNOSIS — Z96641 Presence of right artificial hip joint: Secondary | ICD-10-CM | POA: Diagnosis not present

## 2023-06-03 DIAGNOSIS — Z881 Allergy status to other antibiotic agents status: Secondary | ICD-10-CM | POA: Diagnosis not present

## 2023-06-03 DIAGNOSIS — J690 Pneumonitis due to inhalation of food and vomit: Secondary | ICD-10-CM | POA: Diagnosis not present

## 2023-06-03 DIAGNOSIS — Z8673 Personal history of transient ischemic attack (TIA), and cerebral infarction without residual deficits: Secondary | ICD-10-CM | POA: Diagnosis not present

## 2023-06-03 DIAGNOSIS — G629 Polyneuropathy, unspecified: Secondary | ICD-10-CM | POA: Diagnosis not present

## 2023-06-03 DIAGNOSIS — E7800 Pure hypercholesterolemia, unspecified: Secondary | ICD-10-CM | POA: Diagnosis not present

## 2023-06-03 DIAGNOSIS — I110 Hypertensive heart disease with heart failure: Secondary | ICD-10-CM | POA: Diagnosis not present

## 2023-06-03 DIAGNOSIS — J449 Chronic obstructive pulmonary disease, unspecified: Secondary | ICD-10-CM | POA: Diagnosis not present

## 2023-06-03 DIAGNOSIS — I5032 Chronic diastolic (congestive) heart failure: Secondary | ICD-10-CM | POA: Diagnosis not present

## 2023-06-03 DIAGNOSIS — R112 Nausea with vomiting, unspecified: Secondary | ICD-10-CM | POA: Diagnosis not present

## 2023-06-03 DIAGNOSIS — D72829 Elevated white blood cell count, unspecified: Secondary | ICD-10-CM | POA: Diagnosis not present

## 2023-06-03 DIAGNOSIS — M79604 Pain in right leg: Secondary | ICD-10-CM | POA: Diagnosis not present

## 2023-06-03 DIAGNOSIS — K859 Acute pancreatitis without necrosis or infection, unspecified: Secondary | ICD-10-CM | POA: Diagnosis not present

## 2023-06-05 ENCOUNTER — Inpatient Hospital Stay: Admitting: Hematology and Oncology

## 2023-06-05 ENCOUNTER — Inpatient Hospital Stay

## 2023-06-05 DIAGNOSIS — J441 Chronic obstructive pulmonary disease with (acute) exacerbation: Secondary | ICD-10-CM | POA: Diagnosis not present

## 2023-06-07 DIAGNOSIS — Z5321 Procedure and treatment not carried out due to patient leaving prior to being seen by health care provider: Secondary | ICD-10-CM | POA: Diagnosis not present

## 2023-06-07 DIAGNOSIS — M25551 Pain in right hip: Secondary | ICD-10-CM | POA: Diagnosis not present

## 2023-06-08 DIAGNOSIS — R748 Abnormal levels of other serum enzymes: Secondary | ICD-10-CM | POA: Diagnosis not present

## 2023-06-08 DIAGNOSIS — R109 Unspecified abdominal pain: Secondary | ICD-10-CM | POA: Diagnosis not present

## 2023-06-09 DIAGNOSIS — M25461 Effusion, right knee: Secondary | ICD-10-CM | POA: Diagnosis not present

## 2023-06-09 DIAGNOSIS — D62 Acute posthemorrhagic anemia: Secondary | ICD-10-CM | POA: Diagnosis not present

## 2023-06-09 DIAGNOSIS — J690 Pneumonitis due to inhalation of food and vomit: Secondary | ICD-10-CM | POA: Diagnosis not present

## 2023-06-09 DIAGNOSIS — I5032 Chronic diastolic (congestive) heart failure: Secondary | ICD-10-CM | POA: Diagnosis not present

## 2023-06-09 DIAGNOSIS — M5416 Radiculopathy, lumbar region: Secondary | ICD-10-CM | POA: Diagnosis not present

## 2023-06-09 DIAGNOSIS — G629 Polyneuropathy, unspecified: Secondary | ICD-10-CM | POA: Diagnosis not present

## 2023-06-09 DIAGNOSIS — E039 Hypothyroidism, unspecified: Secondary | ICD-10-CM | POA: Diagnosis not present

## 2023-06-09 DIAGNOSIS — Z8719 Personal history of other diseases of the digestive system: Secondary | ICD-10-CM | POA: Diagnosis not present

## 2023-06-09 DIAGNOSIS — Z881 Allergy status to other antibiotic agents status: Secondary | ICD-10-CM | POA: Diagnosis not present

## 2023-06-09 DIAGNOSIS — M79604 Pain in right leg: Secondary | ICD-10-CM | POA: Diagnosis not present

## 2023-06-09 DIAGNOSIS — R109 Unspecified abdominal pain: Secondary | ICD-10-CM | POA: Diagnosis not present

## 2023-06-09 DIAGNOSIS — D509 Iron deficiency anemia, unspecified: Secondary | ICD-10-CM | POA: Diagnosis not present

## 2023-06-09 DIAGNOSIS — I110 Hypertensive heart disease with heart failure: Secondary | ICD-10-CM | POA: Diagnosis not present

## 2023-06-09 DIAGNOSIS — Z9682 Presence of neurostimulator: Secondary | ICD-10-CM | POA: Diagnosis not present

## 2023-06-09 DIAGNOSIS — J449 Chronic obstructive pulmonary disease, unspecified: Secondary | ICD-10-CM | POA: Diagnosis not present

## 2023-06-09 DIAGNOSIS — Z8673 Personal history of transient ischemic attack (TIA), and cerebral infarction without residual deficits: Secondary | ICD-10-CM | POA: Diagnosis not present

## 2023-06-09 DIAGNOSIS — E7800 Pure hypercholesterolemia, unspecified: Secondary | ICD-10-CM | POA: Diagnosis not present

## 2023-06-09 DIAGNOSIS — G8929 Other chronic pain: Secondary | ICD-10-CM | POA: Diagnosis not present

## 2023-06-09 DIAGNOSIS — T501X5A Adverse effect of loop [high-ceiling] diuretics, initial encounter: Secondary | ICD-10-CM | POA: Diagnosis not present

## 2023-06-09 DIAGNOSIS — F17210 Nicotine dependence, cigarettes, uncomplicated: Secondary | ICD-10-CM | POA: Diagnosis not present

## 2023-06-09 DIAGNOSIS — K859 Acute pancreatitis without necrosis or infection, unspecified: Secondary | ICD-10-CM | POA: Diagnosis not present

## 2023-06-09 DIAGNOSIS — R110 Nausea: Secondary | ICD-10-CM | POA: Diagnosis not present

## 2023-06-09 DIAGNOSIS — Z96651 Presence of right artificial knee joint: Secondary | ICD-10-CM | POA: Diagnosis not present

## 2023-06-09 DIAGNOSIS — E860 Dehydration: Secondary | ICD-10-CM | POA: Diagnosis not present

## 2023-06-09 DIAGNOSIS — M5441 Lumbago with sciatica, right side: Secondary | ICD-10-CM | POA: Diagnosis not present

## 2023-06-09 DIAGNOSIS — M109 Gout, unspecified: Secondary | ICD-10-CM | POA: Diagnosis not present

## 2023-06-09 DIAGNOSIS — R748 Abnormal levels of other serum enzymes: Secondary | ICD-10-CM | POA: Diagnosis not present

## 2023-06-09 DIAGNOSIS — M1990 Unspecified osteoarthritis, unspecified site: Secondary | ICD-10-CM | POA: Diagnosis not present

## 2023-06-15 DIAGNOSIS — E039 Hypothyroidism, unspecified: Secondary | ICD-10-CM | POA: Diagnosis not present

## 2023-06-15 DIAGNOSIS — I5032 Chronic diastolic (congestive) heart failure: Secondary | ICD-10-CM | POA: Diagnosis not present

## 2023-06-15 DIAGNOSIS — D62 Acute posthemorrhagic anemia: Secondary | ICD-10-CM | POA: Diagnosis not present

## 2023-06-15 DIAGNOSIS — G8929 Other chronic pain: Secondary | ICD-10-CM | POA: Diagnosis not present

## 2023-06-15 DIAGNOSIS — E7800 Pure hypercholesterolemia, unspecified: Secondary | ICD-10-CM | POA: Diagnosis not present

## 2023-06-15 DIAGNOSIS — M1990 Unspecified osteoarthritis, unspecified site: Secondary | ICD-10-CM | POA: Diagnosis not present

## 2023-06-15 DIAGNOSIS — F17210 Nicotine dependence, cigarettes, uncomplicated: Secondary | ICD-10-CM | POA: Diagnosis not present

## 2023-06-15 DIAGNOSIS — M109 Gout, unspecified: Secondary | ICD-10-CM | POA: Diagnosis not present

## 2023-06-15 DIAGNOSIS — Z7902 Long term (current) use of antithrombotics/antiplatelets: Secondary | ICD-10-CM | POA: Diagnosis not present

## 2023-06-15 DIAGNOSIS — K859 Acute pancreatitis without necrosis or infection, unspecified: Secondary | ICD-10-CM | POA: Diagnosis not present

## 2023-06-15 DIAGNOSIS — Z9682 Presence of neurostimulator: Secondary | ICD-10-CM | POA: Diagnosis not present

## 2023-06-15 DIAGNOSIS — G43001 Migraine without aura, not intractable, with status migrainosus: Secondary | ICD-10-CM | POA: Diagnosis not present

## 2023-06-15 DIAGNOSIS — G629 Polyneuropathy, unspecified: Secondary | ICD-10-CM | POA: Diagnosis not present

## 2023-06-15 DIAGNOSIS — M5441 Lumbago with sciatica, right side: Secondary | ICD-10-CM | POA: Diagnosis not present

## 2023-06-15 DIAGNOSIS — D509 Iron deficiency anemia, unspecified: Secondary | ICD-10-CM | POA: Diagnosis not present

## 2023-06-15 DIAGNOSIS — K760 Fatty (change of) liver, not elsewhere classified: Secondary | ICD-10-CM | POA: Diagnosis not present

## 2023-06-15 DIAGNOSIS — I6522 Occlusion and stenosis of left carotid artery: Secondary | ICD-10-CM | POA: Diagnosis not present

## 2023-06-15 DIAGNOSIS — I110 Hypertensive heart disease with heart failure: Secondary | ICD-10-CM | POA: Diagnosis not present

## 2023-06-15 DIAGNOSIS — Z79899 Other long term (current) drug therapy: Secondary | ICD-10-CM | POA: Diagnosis not present

## 2023-06-15 DIAGNOSIS — J449 Chronic obstructive pulmonary disease, unspecified: Secondary | ICD-10-CM | POA: Diagnosis not present

## 2023-06-15 DIAGNOSIS — I69351 Hemiplegia and hemiparesis following cerebral infarction affecting right dominant side: Secondary | ICD-10-CM | POA: Diagnosis not present

## 2023-06-15 NOTE — Progress Notes (Signed)
 [facility] [address] [place],  [place]  [address] [phone]  Clinic Day:  06/15/2023   Referring physician: Gaither Juba, MD  Patient Care Team: Patient Care Team: Gaither Juba, MD as PCP - General (Family Medicine)   REASON FOR CONSULTATION:    HISTORY OF PRESENT ILLNESS:  Shelley Underwood is a 73 y.o. female with a history of  who is referred in consultation by  for assessment and management.    REVIEW OF SYSTEMS:  Review of Systems - Oncology   VITALS:   There were no vitals taken for this visit.  Wt Readings from Last 3 Encounters:  11/06/14 247 lb 8 oz (112.3 kg)  10/27/14 247 lb 8 oz (112.3 kg)  10/05/14 236 lb 2 oz (107.1 kg)    There is no height or weight on file to calculate BMI.  Performance status (ECOG):   PHYSICAL EXAM:  Physical Exam   LABS:      Latest Ref Rng & Units 11/08/2014    4:55 AM 11/07/2014    5:25 AM 11/06/2014    4:10 PM  CBC  WBC 4.0 - 10.5 K/uL 7.4  8.9  9.3   Hemoglobin 12.0 - 15.0 g/dL 73.2  20.2  54.2   Hematocrit 36.0 - 46.0 % 31.4  32.0  35.8   Platelets 150 - 400 K/uL 228  243  271       Latest Ref Rng & Units 11/07/2014    5:25 AM 11/06/2014    4:10 PM 10/27/2014   12:16 PM  CMP  Glucose 65 - 99 mg/dL 706   96   BUN 6 - 20 mg/dL 11   13   Creatinine 2.37 - 1.00 mg/dL 6.28  3.15  1.76   Sodium 135 - 145 mmol/L 135   139   Potassium 3.5 - 5.1 mmol/L 4.1   4.9   Chloride 101 - 111 mmol/L 103   110   CO2 22 - 32 mmol/L 25   22   Calcium  8.9 - 10.3 mg/dL 8.5   9.3   Total Protein 6.5 - 8.1 g/dL   6.6   Total Bilirubin 0.3 - 1.2 mg/dL   0.3   Alkaline Phos 38 - 126 U/L   102   AST 15 - 41 U/L   21   ALT 14 - 54 U/L   11      No results found for: "CEA1", "CEA" / No results found for: "CEA1", "CEA" No results found for: "PSA1" No results found for: "HYW737" No results found for: "CAN125"  No results found for: "TOTALPROTELP", "ALBUMINELP", "A1GS", "A2GS", "BETS", "BETA2SER",  "GAMS", "MSPIKE", "SPEI" No results found for: "TIBC", "FERRITIN", "IRONPCTSAT" No results found for: "LDH"  STUDIES:  No results found.    HISTORY:   Past Medical History:  Diagnosis Date   Anxiety    Arthritis    Carotid occlusion, left    Depression    Hypercholesteremia    Hypertension    Hypothyroidism    Migraine    Restless leg syndrome    Stroke (HCC) 09/2021    Past Surgical History:  Procedure Laterality Date   ABDOMINAL HYSTERECTOMY     CHOLECYSTECTOMY     TONSILLECTOMY     TOTAL KNEE ARTHROPLASTY Right 2002   TOTAL KNEE ARTHROPLASTY Left 11/06/2014   TOTAL KNEE ARTHROPLASTY Left 11/06/2014   Procedure: LEFT TOTAL KNEE ARTHROPLASTY;  Surgeon: Christie Cox, MD;  Location: [facility] OR;  Service: Orthopedics;  Laterality: Left;    No family history on file.  Social History:  reports that she has been smoking cigarettes. She has a 15 pack-year smoking history. She has never used smokeless tobacco. She reports that she does not drink alcohol and does not use drugs.The patient is   today.  Allergies:  Allergies  Allergen Reactions   Macrobid [Nitrofurantoin Monohyd Macro] Anaphylaxis   Nitrofurantoin Anaphylaxis   Nitrofurantoin Macrocrystal Anaphylaxis   Oxycodone Other (See Comments)    Pt states causes  hallucinations  Other reaction(s): Mental Status Changes (intolerance)  Other reaction(s): Other  'makes me crazy'  Pt states causes  hallucinations   Oxycodone Hcl Other (See Comments)    Other reaction(s): Other 'makes me crazy'   Penicillins Rash and Dermatitis    Patient states this happened when she was younger and she believes she has "grown out of it"   Levaquin [Levofloxacin] Other (See Comments)    puritus    Current Medications: Current Outpatient Medications  Medication Sig Dispense Refill   albuterol (VENTOLIN HFA) 108 (90 Base) MCG/ACT inhaler Inhale 2 puffs into the lungs every 6 (six) hours as needed.     allopurinol (ZYLOPRIM) 100 MG  tablet Take 100 mg by mouth daily.     ALPRAZolam (XANAX) 0.5 MG tablet Take 0.5 mg by mouth 2 (two) times daily as needed.     amLODipine (NORVASC) 5 MG tablet Take 2.5 mg by mouth daily.     aspirin EC 81 MG tablet Take 81 mg by mouth daily.     B Complex Vitamins (B-COMPLEX/B-12 PO) Take 1 tablet by mouth daily. 2,571mcg     butalbital-acetaminophen -caffeine (FIORICET) 50-325-40 MG tablet Take 1 tablet by mouth 3 (three) times daily as needed.     clopidogrel (PLAVIX) 75 MG tablet Take 75 mg by mouth daily.     doxycycline (ADOXA) 100 MG tablet Take 100 mg by mouth 2 (two) times daily.     furosemide (LASIX) 40 MG tablet Take 40 mg by mouth daily.     levocetirizine (XYZAL) 5 MG tablet Take 5 mg by mouth every evening.     levothyroxine  (SYNTHROID ) 150 MCG tablet Take 150 mcg by mouth daily before breakfast.     ondansetron  (ZOFRAN -ODT) 8 MG disintegrating tablet Take 8 mg by mouth every 8 (eight) hours as needed.     promethazine -dextromethorphan (PROMETHAZINE -DM) 6.25-15 MG/5ML syrup Take 5 mLs by mouth 4 (four) times daily as needed for cough.     atorvastatin  (LIPITOR) 20 MG tablet Take 20 mg by mouth daily.     buPROPion  (WELLBUTRIN  XL) 150 MG 24 hr tablet Take 150 mg by mouth daily.     enoxaparin  (LOVENOX ) 40 MG/0.4ML injection Inject 0.4 mLs (40 mg total) into the skin daily. (Patient not taking: Reported on 10/17/2015) 12 Syringe 0   escitalopram  (LEXAPRO ) 10 MG tablet Take 10 mg by mouth daily.     gabapentin (NEURONTIN) 300 MG capsule Take 300 mg by mouth 2 (two) times daily.     HYDROcodone -acetaminophen  (NORCO) 10-325 MG tablet Take 1-2 tablets by mouth every 4 (four) hours as needed (breakthrough pain). 90 tablet 0   methocarbamol  (ROBAXIN ) 500 MG tablet Take 1-2 tablets (500-1,000 mg total) by mouth every 6 (six) hours as needed for muscle spasms. 60 tablet 0   metoprolol  (LOPRESSOR ) 100 MG tablet Take 100 mg by mouth daily.     omega-3 acid ethyl esters (LOVAZA) 1 G capsule  Take 1 g  by mouth daily.     pantoprazole (PROTONIX) 20 MG tablet Take 20 mg by mouth daily.     Potassium Chloride ER 20 MEQ TBCR Take 1 tablet by mouth daily.     pramipexole  (MIRAPEX ) 0.5 MG tablet Take 0.5 mg by mouth at bedtime.     triamterene-hydrochlorothiazide (MAXZIDE-25) 37.5-25 MG per tablet Take 1 tablet by mouth daily.     No current facility-administered medications for this visit.     ASSESSMENT & PLAN:   Assessment:  Shelley Underwood is a 73 y.o. female   Plan: 1.    I discussed the assessment and treatment plan with the patient.  The patient was provided an opportunity to ask questions and all were answered.  The patient agreed with the plan and demonstrated an understanding of the instructions.    Thank you for the referral     minutes was spent in patient care.  This included time spent preparing to see the patient (e.g., review of tests), obtaining and/or reviewing separately obtained history, counseling and educating the patient/family/caregiver, ordering medications, tests, or procedures; documenting clinical information in the electronic or other health record, independently interpreting results and communicating results to the patient/family/caregiver as well as coordination of care.      Alfonso Ike, PA-C   Physician Assistant [facility] [phone]

## 2023-06-18 DIAGNOSIS — M1990 Unspecified osteoarthritis, unspecified site: Secondary | ICD-10-CM | POA: Diagnosis not present

## 2023-06-18 DIAGNOSIS — E039 Hypothyroidism, unspecified: Secondary | ICD-10-CM | POA: Diagnosis not present

## 2023-06-18 DIAGNOSIS — K859 Acute pancreatitis without necrosis or infection, unspecified: Secondary | ICD-10-CM | POA: Diagnosis not present

## 2023-06-18 DIAGNOSIS — M109 Gout, unspecified: Secondary | ICD-10-CM | POA: Diagnosis not present

## 2023-06-18 DIAGNOSIS — G629 Polyneuropathy, unspecified: Secondary | ICD-10-CM | POA: Diagnosis not present

## 2023-06-18 DIAGNOSIS — J449 Chronic obstructive pulmonary disease, unspecified: Secondary | ICD-10-CM | POA: Diagnosis not present

## 2023-06-18 DIAGNOSIS — I5032 Chronic diastolic (congestive) heart failure: Secondary | ICD-10-CM | POA: Diagnosis not present

## 2023-06-18 DIAGNOSIS — Z7902 Long term (current) use of antithrombotics/antiplatelets: Secondary | ICD-10-CM | POA: Diagnosis not present

## 2023-06-18 DIAGNOSIS — D509 Iron deficiency anemia, unspecified: Secondary | ICD-10-CM | POA: Diagnosis not present

## 2023-06-18 DIAGNOSIS — E7800 Pure hypercholesterolemia, unspecified: Secondary | ICD-10-CM | POA: Diagnosis not present

## 2023-06-18 DIAGNOSIS — I6522 Occlusion and stenosis of left carotid artery: Secondary | ICD-10-CM | POA: Diagnosis not present

## 2023-06-18 DIAGNOSIS — G43001 Migraine without aura, not intractable, with status migrainosus: Secondary | ICD-10-CM | POA: Diagnosis not present

## 2023-06-18 DIAGNOSIS — I69351 Hemiplegia and hemiparesis following cerebral infarction affecting right dominant side: Secondary | ICD-10-CM | POA: Diagnosis not present

## 2023-06-18 DIAGNOSIS — G8929 Other chronic pain: Secondary | ICD-10-CM | POA: Diagnosis not present

## 2023-06-18 DIAGNOSIS — D649 Anemia, unspecified: Secondary | ICD-10-CM | POA: Diagnosis not present

## 2023-06-18 DIAGNOSIS — D62 Acute posthemorrhagic anemia: Secondary | ICD-10-CM | POA: Diagnosis not present

## 2023-06-18 DIAGNOSIS — Z79899 Other long term (current) drug therapy: Secondary | ICD-10-CM | POA: Diagnosis not present

## 2023-06-18 DIAGNOSIS — F17210 Nicotine dependence, cigarettes, uncomplicated: Secondary | ICD-10-CM | POA: Diagnosis not present

## 2023-06-18 DIAGNOSIS — E875 Hyperkalemia: Secondary | ICD-10-CM | POA: Diagnosis not present

## 2023-06-18 DIAGNOSIS — K760 Fatty (change of) liver, not elsewhere classified: Secondary | ICD-10-CM | POA: Diagnosis not present

## 2023-06-18 DIAGNOSIS — M5441 Lumbago with sciatica, right side: Secondary | ICD-10-CM | POA: Diagnosis not present

## 2023-06-18 DIAGNOSIS — I110 Hypertensive heart disease with heart failure: Secondary | ICD-10-CM | POA: Diagnosis not present

## 2023-06-18 DIAGNOSIS — Z9682 Presence of neurostimulator: Secondary | ICD-10-CM | POA: Diagnosis not present

## 2023-06-22 DIAGNOSIS — Z9682 Presence of neurostimulator: Secondary | ICD-10-CM | POA: Diagnosis not present

## 2023-06-22 DIAGNOSIS — J449 Chronic obstructive pulmonary disease, unspecified: Secondary | ICD-10-CM | POA: Diagnosis not present

## 2023-06-22 DIAGNOSIS — I69351 Hemiplegia and hemiparesis following cerebral infarction affecting right dominant side: Secondary | ICD-10-CM | POA: Diagnosis not present

## 2023-06-22 DIAGNOSIS — Z79899 Other long term (current) drug therapy: Secondary | ICD-10-CM | POA: Diagnosis not present

## 2023-06-22 DIAGNOSIS — K859 Acute pancreatitis without necrosis or infection, unspecified: Secondary | ICD-10-CM | POA: Diagnosis not present

## 2023-06-22 DIAGNOSIS — D62 Acute posthemorrhagic anemia: Secondary | ICD-10-CM | POA: Diagnosis not present

## 2023-06-22 DIAGNOSIS — G43001 Migraine without aura, not intractable, with status migrainosus: Secondary | ICD-10-CM | POA: Diagnosis not present

## 2023-06-22 DIAGNOSIS — M109 Gout, unspecified: Secondary | ICD-10-CM | POA: Diagnosis not present

## 2023-06-22 DIAGNOSIS — M5441 Lumbago with sciatica, right side: Secondary | ICD-10-CM | POA: Diagnosis not present

## 2023-06-22 DIAGNOSIS — I5032 Chronic diastolic (congestive) heart failure: Secondary | ICD-10-CM | POA: Diagnosis not present

## 2023-06-22 DIAGNOSIS — Z7902 Long term (current) use of antithrombotics/antiplatelets: Secondary | ICD-10-CM | POA: Diagnosis not present

## 2023-06-22 DIAGNOSIS — G8929 Other chronic pain: Secondary | ICD-10-CM | POA: Diagnosis not present

## 2023-06-22 DIAGNOSIS — G629 Polyneuropathy, unspecified: Secondary | ICD-10-CM | POA: Diagnosis not present

## 2023-06-22 DIAGNOSIS — M1990 Unspecified osteoarthritis, unspecified site: Secondary | ICD-10-CM | POA: Diagnosis not present

## 2023-06-22 DIAGNOSIS — I6522 Occlusion and stenosis of left carotid artery: Secondary | ICD-10-CM | POA: Diagnosis not present

## 2023-06-22 DIAGNOSIS — D509 Iron deficiency anemia, unspecified: Secondary | ICD-10-CM | POA: Diagnosis not present

## 2023-06-22 DIAGNOSIS — E7800 Pure hypercholesterolemia, unspecified: Secondary | ICD-10-CM | POA: Diagnosis not present

## 2023-06-22 DIAGNOSIS — E039 Hypothyroidism, unspecified: Secondary | ICD-10-CM | POA: Diagnosis not present

## 2023-06-22 DIAGNOSIS — I110 Hypertensive heart disease with heart failure: Secondary | ICD-10-CM | POA: Diagnosis not present

## 2023-06-22 DIAGNOSIS — F17210 Nicotine dependence, cigarettes, uncomplicated: Secondary | ICD-10-CM | POA: Diagnosis not present

## 2023-06-22 DIAGNOSIS — K760 Fatty (change of) liver, not elsewhere classified: Secondary | ICD-10-CM | POA: Diagnosis not present

## 2023-06-23 ENCOUNTER — Encounter: Payer: Self-pay | Admitting: Oncology

## 2023-06-25 DIAGNOSIS — G8929 Other chronic pain: Secondary | ICD-10-CM | POA: Diagnosis not present

## 2023-06-25 DIAGNOSIS — Z7902 Long term (current) use of antithrombotics/antiplatelets: Secondary | ICD-10-CM | POA: Diagnosis not present

## 2023-06-25 DIAGNOSIS — Z9682 Presence of neurostimulator: Secondary | ICD-10-CM | POA: Diagnosis not present

## 2023-06-25 DIAGNOSIS — E039 Hypothyroidism, unspecified: Secondary | ICD-10-CM | POA: Diagnosis not present

## 2023-06-25 DIAGNOSIS — J449 Chronic obstructive pulmonary disease, unspecified: Secondary | ICD-10-CM | POA: Diagnosis not present

## 2023-06-25 DIAGNOSIS — M5441 Lumbago with sciatica, right side: Secondary | ICD-10-CM | POA: Diagnosis not present

## 2023-06-25 DIAGNOSIS — M1990 Unspecified osteoarthritis, unspecified site: Secondary | ICD-10-CM | POA: Diagnosis not present

## 2023-06-25 DIAGNOSIS — I110 Hypertensive heart disease with heart failure: Secondary | ICD-10-CM | POA: Diagnosis not present

## 2023-06-25 DIAGNOSIS — E7800 Pure hypercholesterolemia, unspecified: Secondary | ICD-10-CM | POA: Diagnosis not present

## 2023-06-25 DIAGNOSIS — Z79899 Other long term (current) drug therapy: Secondary | ICD-10-CM | POA: Diagnosis not present

## 2023-06-25 DIAGNOSIS — M109 Gout, unspecified: Secondary | ICD-10-CM | POA: Diagnosis not present

## 2023-06-25 DIAGNOSIS — D62 Acute posthemorrhagic anemia: Secondary | ICD-10-CM | POA: Diagnosis not present

## 2023-06-25 DIAGNOSIS — D509 Iron deficiency anemia, unspecified: Secondary | ICD-10-CM | POA: Diagnosis not present

## 2023-06-25 DIAGNOSIS — K859 Acute pancreatitis without necrosis or infection, unspecified: Secondary | ICD-10-CM | POA: Diagnosis not present

## 2023-06-25 DIAGNOSIS — I5032 Chronic diastolic (congestive) heart failure: Secondary | ICD-10-CM | POA: Diagnosis not present

## 2023-06-25 DIAGNOSIS — K760 Fatty (change of) liver, not elsewhere classified: Secondary | ICD-10-CM | POA: Diagnosis not present

## 2023-06-25 DIAGNOSIS — F17210 Nicotine dependence, cigarettes, uncomplicated: Secondary | ICD-10-CM | POA: Diagnosis not present

## 2023-06-25 DIAGNOSIS — G43001 Migraine without aura, not intractable, with status migrainosus: Secondary | ICD-10-CM | POA: Diagnosis not present

## 2023-06-25 DIAGNOSIS — I69351 Hemiplegia and hemiparesis following cerebral infarction affecting right dominant side: Secondary | ICD-10-CM | POA: Diagnosis not present

## 2023-06-25 DIAGNOSIS — G629 Polyneuropathy, unspecified: Secondary | ICD-10-CM | POA: Diagnosis not present

## 2023-06-25 DIAGNOSIS — I6522 Occlusion and stenosis of left carotid artery: Secondary | ICD-10-CM | POA: Diagnosis not present

## 2023-06-30 DIAGNOSIS — M5136 Other intervertebral disc degeneration, lumbar region with discogenic back pain only: Secondary | ICD-10-CM | POA: Diagnosis not present

## 2023-06-30 DIAGNOSIS — J449 Chronic obstructive pulmonary disease, unspecified: Secondary | ICD-10-CM | POA: Diagnosis not present

## 2023-06-30 DIAGNOSIS — K760 Fatty (change of) liver, not elsewhere classified: Secondary | ICD-10-CM | POA: Diagnosis not present

## 2023-06-30 DIAGNOSIS — Z79899 Other long term (current) drug therapy: Secondary | ICD-10-CM | POA: Diagnosis not present

## 2023-06-30 DIAGNOSIS — E7800 Pure hypercholesterolemia, unspecified: Secondary | ICD-10-CM | POA: Diagnosis not present

## 2023-06-30 DIAGNOSIS — E039 Hypothyroidism, unspecified: Secondary | ICD-10-CM | POA: Diagnosis not present

## 2023-06-30 DIAGNOSIS — Z9682 Presence of neurostimulator: Secondary | ICD-10-CM | POA: Diagnosis not present

## 2023-06-30 DIAGNOSIS — G8929 Other chronic pain: Secondary | ICD-10-CM | POA: Diagnosis not present

## 2023-06-30 DIAGNOSIS — Z9689 Presence of other specified functional implants: Secondary | ICD-10-CM | POA: Diagnosis not present

## 2023-06-30 DIAGNOSIS — D509 Iron deficiency anemia, unspecified: Secondary | ICD-10-CM | POA: Diagnosis not present

## 2023-06-30 DIAGNOSIS — M5134 Other intervertebral disc degeneration, thoracic region: Secondary | ICD-10-CM | POA: Diagnosis not present

## 2023-06-30 DIAGNOSIS — G629 Polyneuropathy, unspecified: Secondary | ICD-10-CM | POA: Diagnosis not present

## 2023-06-30 DIAGNOSIS — M47814 Spondylosis without myelopathy or radiculopathy, thoracic region: Secondary | ICD-10-CM | POA: Diagnosis not present

## 2023-06-30 DIAGNOSIS — K859 Acute pancreatitis without necrosis or infection, unspecified: Secondary | ICD-10-CM | POA: Diagnosis not present

## 2023-06-30 DIAGNOSIS — M5416 Radiculopathy, lumbar region: Secondary | ICD-10-CM | POA: Diagnosis not present

## 2023-06-30 DIAGNOSIS — Z4542 Encounter for adjustment and management of neuropacemaker (brain) (peripheral nerve) (spinal cord): Secondary | ICD-10-CM | POA: Diagnosis not present

## 2023-06-30 DIAGNOSIS — I6522 Occlusion and stenosis of left carotid artery: Secondary | ICD-10-CM | POA: Diagnosis not present

## 2023-06-30 DIAGNOSIS — M1990 Unspecified osteoarthritis, unspecified site: Secondary | ICD-10-CM | POA: Diagnosis not present

## 2023-06-30 DIAGNOSIS — F17210 Nicotine dependence, cigarettes, uncomplicated: Secondary | ICD-10-CM | POA: Diagnosis not present

## 2023-06-30 DIAGNOSIS — I5032 Chronic diastolic (congestive) heart failure: Secondary | ICD-10-CM | POA: Diagnosis not present

## 2023-06-30 DIAGNOSIS — I110 Hypertensive heart disease with heart failure: Secondary | ICD-10-CM | POA: Diagnosis not present

## 2023-06-30 DIAGNOSIS — G43001 Migraine without aura, not intractable, with status migrainosus: Secondary | ICD-10-CM | POA: Diagnosis not present

## 2023-06-30 DIAGNOSIS — M5441 Lumbago with sciatica, right side: Secondary | ICD-10-CM | POA: Diagnosis not present

## 2023-06-30 DIAGNOSIS — I69351 Hemiplegia and hemiparesis following cerebral infarction affecting right dominant side: Secondary | ICD-10-CM | POA: Diagnosis not present

## 2023-06-30 DIAGNOSIS — M109 Gout, unspecified: Secondary | ICD-10-CM | POA: Diagnosis not present

## 2023-06-30 DIAGNOSIS — Z7902 Long term (current) use of antithrombotics/antiplatelets: Secondary | ICD-10-CM | POA: Diagnosis not present

## 2023-06-30 DIAGNOSIS — M47816 Spondylosis without myelopathy or radiculopathy, lumbar region: Secondary | ICD-10-CM | POA: Diagnosis not present

## 2023-06-30 DIAGNOSIS — D62 Acute posthemorrhagic anemia: Secondary | ICD-10-CM | POA: Diagnosis not present

## 2023-07-01 ENCOUNTER — Other Ambulatory Visit: Payer: Self-pay | Admitting: Hematology and Oncology

## 2023-07-01 DIAGNOSIS — D509 Iron deficiency anemia, unspecified: Secondary | ICD-10-CM

## 2023-07-01 NOTE — Progress Notes (Signed)
 [facility] [address] [place],  [place]  [address] [phone]  Clinic Day:  07/07/2023   Referring physician: Ina Marcellus RAMAN, MD  Patient Care Team: Patient Care Team: Ina Marcellus RAMAN, MD as PCP - General (Family Medicine)   REASON FOR CONSULTATION:  Iron deficiency anemia  HISTORY OF PRESENT ILLNESS:  Shelley Underwood is a 73 y.o. female with iron deficiency anemia who is referred in consultation by Dr. Marcellus Ina for assessment and management.  She was seen for routine follow-up on April 25.  CBC revealed a hemoglobin of 9.7 with an MCV of 75.  WBCs 10.5, 77% neutrophils, 18% lymphocytes, 5% mid range.  Platelets 508,000.  CMP was normal except for creatinine 1.4, BUN 40, estimated GFR 41, and alkaline phosphatase 189.  CBC on April 25 revealed hemoglobin 9.4 with an MCV of 76, WBCs 11.5, 74% neutrophils, 20% lymphocytes, 6% mid range.  Platelets 509,000.  CBC on April 16 revealed hemoglobin 7.9 with an MCV of 74, WBCs 10.4, 83% neutrophils, 14% lymphocytes, 4% mid range.  Platelets 539,000.  CMP was normal except for alkaline phosphatase of 188.  CBC December 27 revealed a hemoglobin of 10.3 with an MCV of 83, normal white count and platelets.  TSH was elevated at 15 with a normal free T4.   Review of her records show she presented to the [facility] ED in November 2024 when she presented with shortness of breath and headache for a week.  She has been using ibuprofen 4-5 times daily for the headaches and had also been on a course of prednisone for the shortness of breath.  She was found to have a hemoglobin of 6.4 with an MCV of 81.  She denied obvious bleeding.  Iron studies revealed TIBC of 500, serum iron 71, iron saturation 14, and ferritin 5.  She was transfused 2 units of PRBC's as well as IV Feraheme x 1 while admitted.  Her hemoglobin was 8.2 on discharge.  EGD and colonoscopy were done during hospitalization. EGD revealed gastropathy with a small amount  of oozing, a small hiatal hernia and asymptomatic distal esophageal stricture.  Colonoscopy revealed 11 diminutive colon polyps, diverticulosis and external hemorrhoids, no source of bleeding.  Pathology revealed tubular adenomas and hyperplastic polyps.  She was hospitalized at [facility] in [place] in March 2025 with acute respiratory failure due to exacerbation of COPD.  CBC on admission revealed a hemoglobin of 8 with an MCV of 80 normal white count and platelets. Serum iron was 22, TIBC 475, iron saturation 5% and ferritin 8, which is consistent with iron deficiency.  B12 was normal.  Folate was borderline low at 5.7.  TSH was normal.  Her hemoglobin dropped to 7.4 during hospitalization, but was 8 on discharge.  She was not transfused during that admission.  TTE revealed normal left ventricular systolic function with an ejection fraction of 55 to 60%, stage I diastolic dysfunction, mild aortic regurgitation and left atrial enlargement.  She was admitted again to [facility] on April 16, with new heart failure. Her hemoglobin was 7.9 with an MCV of 77, so she was transfused 1 unit of PRBC's.  She was admitted in May with pancreatitis.  Her hemoglobin dropped to 7.5 during hospitalization, so she was transfused 1 unit of packed red blood cells and received IV Feraheme x 1.     The patient reports mild fatigue.  She reports pica to ice.  She denies overt form of blood  loss.  She continues ferrous sulfate daily.  The patient states her last colonoscopy prior to last year was done in [place]  and she did not have polyps at that time.  She is followed by pain management for chronic low back pain and has a spinal cord stimulator in place.  Past medical history: Congestive heart failure, hypothyroidism, hypertension, hypercholesterolemia, osteopenia, migraine, anxiety.  History of pancreatitis, CVA treated at [facility], left carotid artery occlusion, chronic low back pain, status post spinal  cord stimulator. Status post bilateral total knee arthroplasty, cholecystectomy, hysterectomy/BSO, tonsillectomy/adenoidectomy.  Her last mammogram was in 2013.  Social history: She is a current smoker.  She has been smoking less than a pack per day since age 16. She denies alcohol use.  She denies other substance use.  She was born in [place] and lived in [place] most of her life. She lived in [place]  for several years.  She is living in [place] now.  She is widowed and had 3 children, all of whom are deceased.  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   Family history:  No known family history of anemia or other blood dyscrasias. A sister had lung cancer at age 70.  REVIEW OF SYSTEMS:  Review of Systems  Constitutional:  Negative for appetite change, chills, diaphoresis, fatigue, fever and unexpected weight change.  HENT:   Negative for lump/mass, mouth sores, nosebleeds and sore throat.   Respiratory:  Negative for cough, hemoptysis and shortness of breath.   Cardiovascular:  Negative for chest pain and leg swelling.  Gastrointestinal:  Positive for constipation (mild with iron). Negative for abdominal pain, blood in stool, diarrhea, nausea and vomiting.  Endocrine: Negative for hot flashes.  Genitourinary:  Negative for difficulty urinating, dysuria, frequency, hematuria and vaginal bleeding.   Musculoskeletal:  Positive for back pain (lower back). Negative for arthralgias, gait problem, myalgias and neck pain.  Skin:  Negative for itching and rash.  Neurological:  Negative for dizziness, extremity weakness, gait problem, headaches, light-headedness and numbness.  Hematological:  Negative for adenopathy. Does not bruise/bleed easily.  Psychiatric/Behavioral:  Negative for depression and sleep disturbance. The patient is not nervous/anxious.      PHYSICAL EXAMINATION:   Blood pressure 118/66, pulse 84, temperature 98 F (36.7 C),  temperature source Oral, resp. rate 20, height 5' 7.2 (1.707 m), weight 240 lb 8 oz (109.1 kg), SpO2 97%.  Wt Readings from Last 3 Encounters:  07/07/23 240 lb 8 oz (109.1 kg)  11/06/14 247 lb 8 oz (112.3 kg)  10/27/14 247 lb 8 oz (112.3 kg)    Body mass index is 37.44 kg/m.  Performance status (ECOG): 1 - Symptomatic but completely ambulatory    Physical Exam Vitals and nursing note reviewed.  Constitutional:      General: She is not in acute distress.    Appearance: Normal appearance.  HENT:     Head: Normocephalic and atraumatic.     Mouth/Throat:     Mouth: Mucous membranes are moist.     Pharynx: Oropharynx is clear. No oropharyngeal exudate or posterior oropharyngeal erythema.  Eyes:     General: No scleral icterus.    Extraocular Movements: Extraocular movements intact.     Conjunctiva/sclera: Conjunctivae normal.     Pupils: Pupils are equal, round, and reactive to light.  Cardiovascular:     Rate and Rhythm: Normal rate and regular rhythm.     Heart sounds: Normal heart sounds.  No murmur heard.    No friction rub. No gallop.  Pulmonary:     Effort: Pulmonary effort is normal.     Breath sounds: Normal breath sounds. No wheezing, rhonchi or rales.  Abdominal:     General: There is no distension.     Palpations: Abdomen is soft. There is no hepatomegaly, splenomegaly or mass.     Tenderness: There is no abdominal tenderness.  Musculoskeletal:        General: Normal range of motion.     Cervical back: Normal range of motion and neck supple. No tenderness.     Right lower leg: No edema.     Left lower leg: No edema.  Lymphadenopathy:     Cervical: No cervical adenopathy.     Upper Body:     Right upper body: No supraclavicular or axillary adenopathy.     Left upper body: No supraclavicular or axillary adenopathy.     Lower Body: No right inguinal adenopathy. No left inguinal adenopathy.  Skin:    General: Skin is warm and dry.     Coloration: Skin is not  jaundiced.     Findings: No rash.  Neurological:     Mental Status: She is alert and oriented to person, place, and time.     Cranial Nerves: No cranial nerve deficit.  Psychiatric:        Mood and Affect: Mood normal.        Behavior: Behavior normal.        Thought Content: Thought content normal.      LABS:      Latest Ref Rng & Units 07/07/2023    1:44 PM 11/08/2014    4:55 AM 11/07/2014    5:25 AM  CBC  WBC 4.0 - 10.5 K/uL 8.7  7.4  8.9   Hemoglobin 12.0 - 15.0 g/dL 88.1  89.6  89.4   Hematocrit 36.0 - 46.0 % 37.3  31.4  32.0   Platelets 150 - 400 K/uL 394  228  243       Latest Ref Rng & Units 11/07/2014    5:25 AM 11/06/2014    4:10 PM 10/27/2014   12:16 PM  CMP  Glucose 65 - 99 mg/dL 898   96   BUN 6 - 20 mg/dL 11   13   Creatinine 9.55 - 1.00 mg/dL 9.04  9.10  9.00   Sodium 135 - 145 mmol/L 135   139   Potassium 3.5 - 5.1 mmol/L 4.1   4.9   Chloride 101 - 111 mmol/L 103   110   CO2 22 - 32 mmol/L 25   22   Calcium  8.9 - 10.3 mg/dL 8.5   9.3   Total Protein 6.5 - 8.1 g/dL   6.6   Total Bilirubin 0.3 - 1.2 mg/dL   0.3   Alkaline Phos 38 - 126 U/L   102   AST 15 - 41 U/L   21   ALT 14 - 54 U/L   11     Lab Results  Component Value Date   TIBC 416 07/07/2023   FERRITIN 82 07/07/2023   IRONPCTSAT 10 (L) 07/07/2023    Latest Reference Range & Units 07/07/23 13:44  Folate >5.9 ng/mL 7.4  Vitamin B12 180 - 914 pg/mL 826    STUDIES:  No results found.    HISTORY:   Past Medical History:  Diagnosis Date   Anxiety    Arthritis    Carotid occlusion,  left    Depression    Hypercholesteremia    Hypertension    Hypothyroidism    Migraine    Restless leg syndrome    Stroke [facility]) 09/2021    Past Surgical History:  Procedure Laterality Date   ABDOMINAL HYSTERECTOMY     CHOLECYSTECTOMY     pain stimulator      TONSILLECTOMY     TOTAL KNEE ARTHROPLASTY Right 02/04/2000   TOTAL KNEE ARTHROPLASTY Left 11/06/2014   TOTAL KNEE ARTHROPLASTY Left  11/06/2014   Procedure: LEFT TOTAL KNEE ARTHROPLASTY;  Surgeon: Marcey Raman, MD;  Location: [facility] OR;  Service: Orthopedics;  Laterality: Left;    Family History  Problem Relation Age of Onset   Heart attack Mother    Heart attack Father    Cancer - Lung Sister    Parkinson's disease Brother     Social History:  reports that she has been smoking cigarettes. She started smoking about 58 years ago. She has a 73.4 pack-year smoking history. She has never used smokeless tobacco. She reports that she does not drink alcohol and does not use drugs.The patient is alone today.  Allergies:  Allergies  Allergen Reactions   Macrobid [Nitrofurantoin Monohyd Macro] Anaphylaxis   Nitrofurantoin Anaphylaxis   Nitrofurantoin Macrocrystal Anaphylaxis   Oxycodone Other (See Comments)    Pt states causes  hallucinations  Other reaction(s): Mental Status Changes (intolerance)  Other reaction(s): Other  'makes me crazy'  Pt states causes  hallucinations   Oxycodone Hcl Other (See Comments)    Other reaction(s): Other 'makes me crazy'   Penicillins Rash and Dermatitis    Patient states this happened when she was younger and she believes she has grown out of it   Levaquin [Levofloxacin] Other (See Comments)    puritus    Current Medications: Current Outpatient Medications  Medication Sig Dispense Refill   acetaminophen  (TYLENOL ) 500 MG tablet Take 1,000 mg by mouth every 6 (six) hours as needed.     ferrous sulfate 324 MG TBEC Take 324 mg by mouth daily with breakfast.     potassium chloride SA (KLOR-CON M) 20 MEQ tablet Take 20 mEq by mouth once.     torsemide (DEMADEX) 20 MG tablet Take 20 mg by mouth daily.     vitamin C (ASCORBIC ACID) 250 MG tablet Take 250 mg by mouth daily.     albuterol (VENTOLIN HFA) 108 (90 Base) MCG/ACT inhaler Inhale 2 puffs into the lungs every 6 (six) hours as needed.     allopurinol (ZYLOPRIM) 100 MG tablet Take 100 mg by mouth daily.     ALPRAZolam (XANAX) 0.5 MG  tablet Take 0.5 mg by mouth 2 (two) times daily as needed.     amLODipine (NORVASC) 5 MG tablet Take 2.5 mg by mouth daily.     aspirin EC 81 MG tablet Take 81 mg by mouth daily.     B Complex Vitamins (B-COMPLEX/B-12 PO) Take 1 tablet by mouth daily. 2,561mcg     buPROPion  (WELLBUTRIN  XL) 150 MG 24 hr tablet Take 150 mg by mouth daily.     butalbital-acetaminophen -caffeine (FIORICET) 50-325-40 MG tablet Take 1 tablet by mouth 3 (three) times daily as needed.     clopidogrel (PLAVIX) 75 MG tablet Take 75 mg by mouth daily.     escitalopram  (LEXAPRO ) 10 MG tablet Take 10 mg by mouth daily.     gabapentin (NEURONTIN) 300 MG capsule Take 300 mg by mouth 2 (two) times daily.  levocetirizine (XYZAL) 5 MG tablet Take 5 mg by mouth every evening.     levothyroxine  (SYNTHROID ) 150 MCG tablet Take 150 mcg by mouth daily before breakfast.     ondansetron  (ZOFRAN -ODT) 8 MG disintegrating tablet Take 8 mg by mouth every 8 (eight) hours as needed.     pantoprazole (PROTONIX) 20 MG tablet Take 20 mg by mouth daily.     pramipexole  (MIRAPEX ) 0.75 MG tablet Take 0.75 mg by mouth at bedtime.     promethazine -dextromethorphan (PROMETHAZINE -DM) 6.25-15 MG/5ML syrup Take 5 mLs by mouth 4 (four) times daily as needed for cough.     rosuvastatin (CRESTOR) 20 MG tablet Take 20 mg by mouth at bedtime.     No current facility-administered medications for this visit.     ASSESSMENT & PLAN:   Assessment/Plan:  Shelley Underwood is a 73 y.o. female iron deficiency anemia felt to be most likely due to chronic GI blood loss.  Her hemoglobin and iron stores have improved since received IV Feraheme again in May.  As this is the case, I will have her continue oral iron supplement and plan to follow her closely.  I will see her back in 1 month with a CBC, iron/TIBC and ferritin.  I discussed the assessment and plan with the patient.  The patient was provided an opportunity to ask questions and all were answered.  The  patient agreed with the plan and demonstrated an understanding of the instructions.    Thank you for the referral.    45 minutes was spent in patient care.  This included time spent preparing to see the patient (e.g., review of tests), obtaining and/or reviewing separately obtained history, counseling and educating the patient/family/caregiver, ordering medications, tests, or procedures; documenting clinical information in the electronic or other health record, independently interpreting results and communicating results to the patient/family/caregiver as well as coordination of care.      Andrez DELENA Foy, PA-C   Physician Assistant [facility] [phone]

## 2023-07-02 DIAGNOSIS — Z79899 Other long term (current) drug therapy: Secondary | ICD-10-CM | POA: Diagnosis not present

## 2023-07-02 DIAGNOSIS — F17210 Nicotine dependence, cigarettes, uncomplicated: Secondary | ICD-10-CM | POA: Diagnosis not present

## 2023-07-02 DIAGNOSIS — M5441 Lumbago with sciatica, right side: Secondary | ICD-10-CM | POA: Diagnosis not present

## 2023-07-02 DIAGNOSIS — J449 Chronic obstructive pulmonary disease, unspecified: Secondary | ICD-10-CM | POA: Diagnosis not present

## 2023-07-02 DIAGNOSIS — E039 Hypothyroidism, unspecified: Secondary | ICD-10-CM | POA: Diagnosis not present

## 2023-07-02 DIAGNOSIS — Z7902 Long term (current) use of antithrombotics/antiplatelets: Secondary | ICD-10-CM | POA: Diagnosis not present

## 2023-07-02 DIAGNOSIS — G8929 Other chronic pain: Secondary | ICD-10-CM | POA: Diagnosis not present

## 2023-07-02 DIAGNOSIS — D509 Iron deficiency anemia, unspecified: Secondary | ICD-10-CM | POA: Diagnosis not present

## 2023-07-02 DIAGNOSIS — Z9682 Presence of neurostimulator: Secondary | ICD-10-CM | POA: Diagnosis not present

## 2023-07-02 DIAGNOSIS — I110 Hypertensive heart disease with heart failure: Secondary | ICD-10-CM | POA: Diagnosis not present

## 2023-07-02 DIAGNOSIS — G629 Polyneuropathy, unspecified: Secondary | ICD-10-CM | POA: Diagnosis not present

## 2023-07-02 DIAGNOSIS — D62 Acute posthemorrhagic anemia: Secondary | ICD-10-CM | POA: Diagnosis not present

## 2023-07-02 DIAGNOSIS — K859 Acute pancreatitis without necrosis or infection, unspecified: Secondary | ICD-10-CM | POA: Diagnosis not present

## 2023-07-02 DIAGNOSIS — K760 Fatty (change of) liver, not elsewhere classified: Secondary | ICD-10-CM | POA: Diagnosis not present

## 2023-07-02 DIAGNOSIS — G43001 Migraine without aura, not intractable, with status migrainosus: Secondary | ICD-10-CM | POA: Diagnosis not present

## 2023-07-02 DIAGNOSIS — E7800 Pure hypercholesterolemia, unspecified: Secondary | ICD-10-CM | POA: Diagnosis not present

## 2023-07-02 DIAGNOSIS — I6522 Occlusion and stenosis of left carotid artery: Secondary | ICD-10-CM | POA: Diagnosis not present

## 2023-07-02 DIAGNOSIS — M1990 Unspecified osteoarthritis, unspecified site: Secondary | ICD-10-CM | POA: Diagnosis not present

## 2023-07-02 DIAGNOSIS — I5032 Chronic diastolic (congestive) heart failure: Secondary | ICD-10-CM | POA: Diagnosis not present

## 2023-07-02 DIAGNOSIS — I69351 Hemiplegia and hemiparesis following cerebral infarction affecting right dominant side: Secondary | ICD-10-CM | POA: Diagnosis not present

## 2023-07-02 DIAGNOSIS — M109 Gout, unspecified: Secondary | ICD-10-CM | POA: Diagnosis not present

## 2023-07-03 DIAGNOSIS — M5441 Lumbago with sciatica, right side: Secondary | ICD-10-CM | POA: Diagnosis not present

## 2023-07-03 DIAGNOSIS — I5032 Chronic diastolic (congestive) heart failure: Secondary | ICD-10-CM | POA: Diagnosis not present

## 2023-07-03 DIAGNOSIS — G8929 Other chronic pain: Secondary | ICD-10-CM | POA: Diagnosis not present

## 2023-07-06 DIAGNOSIS — J449 Chronic obstructive pulmonary disease, unspecified: Secondary | ICD-10-CM | POA: Diagnosis not present

## 2023-07-06 DIAGNOSIS — J441 Chronic obstructive pulmonary disease with (acute) exacerbation: Secondary | ICD-10-CM | POA: Diagnosis not present

## 2023-07-07 ENCOUNTER — Inpatient Hospital Stay: Admitting: Hematology and Oncology

## 2023-07-07 ENCOUNTER — Encounter: Payer: Self-pay | Admitting: Hematology and Oncology

## 2023-07-07 ENCOUNTER — Inpatient Hospital Stay: Attending: Hematology and Oncology

## 2023-07-07 VITALS — BP 118/66 | HR 84 | Temp 98.0°F | Resp 20 | Ht 67.2 in | Wt 240.5 lb

## 2023-07-07 DIAGNOSIS — G629 Polyneuropathy, unspecified: Secondary | ICD-10-CM | POA: Diagnosis not present

## 2023-07-07 DIAGNOSIS — F17210 Nicotine dependence, cigarettes, uncomplicated: Secondary | ICD-10-CM | POA: Insufficient documentation

## 2023-07-07 DIAGNOSIS — E039 Hypothyroidism, unspecified: Secondary | ICD-10-CM | POA: Diagnosis not present

## 2023-07-07 DIAGNOSIS — Z7902 Long term (current) use of antithrombotics/antiplatelets: Secondary | ICD-10-CM | POA: Diagnosis not present

## 2023-07-07 DIAGNOSIS — I69351 Hemiplegia and hemiparesis following cerebral infarction affecting right dominant side: Secondary | ICD-10-CM | POA: Diagnosis not present

## 2023-07-07 DIAGNOSIS — Z79899 Other long term (current) drug therapy: Secondary | ICD-10-CM | POA: Diagnosis not present

## 2023-07-07 DIAGNOSIS — Z801 Family history of malignant neoplasm of trachea, bronchus and lung: Secondary | ICD-10-CM | POA: Diagnosis not present

## 2023-07-07 DIAGNOSIS — D509 Iron deficiency anemia, unspecified: Secondary | ICD-10-CM

## 2023-07-07 DIAGNOSIS — K922 Gastrointestinal hemorrhage, unspecified: Secondary | ICD-10-CM

## 2023-07-07 DIAGNOSIS — Z9682 Presence of neurostimulator: Secondary | ICD-10-CM | POA: Diagnosis not present

## 2023-07-07 DIAGNOSIS — E7800 Pure hypercholesterolemia, unspecified: Secondary | ICD-10-CM | POA: Diagnosis not present

## 2023-07-07 DIAGNOSIS — D62 Acute posthemorrhagic anemia: Secondary | ICD-10-CM | POA: Diagnosis not present

## 2023-07-07 DIAGNOSIS — I5032 Chronic diastolic (congestive) heart failure: Secondary | ICD-10-CM | POA: Diagnosis not present

## 2023-07-07 DIAGNOSIS — D500 Iron deficiency anemia secondary to blood loss (chronic): Secondary | ICD-10-CM | POA: Insufficient documentation

## 2023-07-07 DIAGNOSIS — M1990 Unspecified osteoarthritis, unspecified site: Secondary | ICD-10-CM | POA: Diagnosis not present

## 2023-07-07 DIAGNOSIS — J449 Chronic obstructive pulmonary disease, unspecified: Secondary | ICD-10-CM | POA: Diagnosis not present

## 2023-07-07 DIAGNOSIS — G8929 Other chronic pain: Secondary | ICD-10-CM | POA: Diagnosis not present

## 2023-07-07 DIAGNOSIS — G43001 Migraine without aura, not intractable, with status migrainosus: Secondary | ICD-10-CM | POA: Diagnosis not present

## 2023-07-07 DIAGNOSIS — M109 Gout, unspecified: Secondary | ICD-10-CM | POA: Diagnosis not present

## 2023-07-07 DIAGNOSIS — K859 Acute pancreatitis without necrosis or infection, unspecified: Secondary | ICD-10-CM | POA: Diagnosis not present

## 2023-07-07 DIAGNOSIS — M5441 Lumbago with sciatica, right side: Secondary | ICD-10-CM | POA: Diagnosis not present

## 2023-07-07 DIAGNOSIS — I110 Hypertensive heart disease with heart failure: Secondary | ICD-10-CM | POA: Diagnosis not present

## 2023-07-07 DIAGNOSIS — K760 Fatty (change of) liver, not elsewhere classified: Secondary | ICD-10-CM | POA: Diagnosis not present

## 2023-07-07 DIAGNOSIS — I6522 Occlusion and stenosis of left carotid artery: Secondary | ICD-10-CM | POA: Diagnosis not present

## 2023-07-07 LAB — RETICULOCYTES
Immature Retic Fract: 9.1 % (ref 2.3–15.9)
RBC.: 4.42 MIL/uL (ref 3.87–5.11)
Retic Count, Absolute: 98.1 10*3/uL (ref 19.0–186.0)
Retic Ct Pct: 2.2 % (ref 0.4–3.1)

## 2023-07-07 LAB — CBC WITH DIFFERENTIAL (CANCER CENTER ONLY)
Abs Immature Granulocytes: 0.03 10*3/uL (ref 0.00–0.07)
Basophils Absolute: 0 10*3/uL (ref 0.0–0.1)
Basophils Relative: 1 %
Eosinophils Absolute: 0.2 10*3/uL (ref 0.0–0.5)
Eosinophils Relative: 2 %
HCT: 37.3 % (ref 36.0–46.0)
Hemoglobin: 11.8 g/dL — ABNORMAL LOW (ref 12.0–15.0)
Immature Granulocytes: 0 %
Lymphocytes Relative: 15 %
Lymphs Abs: 1.3 10*3/uL (ref 0.7–4.0)
MCH: 26.5 pg (ref 26.0–34.0)
MCHC: 31.6 g/dL (ref 30.0–36.0)
MCV: 83.8 fL (ref 80.0–100.0)
Monocytes Absolute: 0.6 10*3/uL (ref 0.1–1.0)
Monocytes Relative: 7 %
Neutro Abs: 6.5 10*3/uL (ref 1.7–7.7)
Neutrophils Relative %: 75 %
Platelet Count: 394 10*3/uL (ref 150–400)
RBC: 4.45 MIL/uL (ref 3.87–5.11)
RDW: 23 % — ABNORMAL HIGH (ref 11.5–15.5)
WBC Count: 8.7 10*3/uL (ref 4.0–10.5)
nRBC: 0 % (ref 0.0–0.2)

## 2023-07-07 LAB — FERRITIN: Ferritin: 82 ng/mL (ref 11–307)

## 2023-07-07 LAB — IRON AND TIBC
Iron: 42 ug/dL (ref 28–170)
Saturation Ratios: 10 % — ABNORMAL LOW (ref 10.4–31.8)
TIBC: 416 ug/dL (ref 250–450)
UIBC: 374 ug/dL

## 2023-07-07 LAB — VITAMIN B12: Vitamin B-12: 826 pg/mL (ref 180–914)

## 2023-07-07 LAB — TECHNOLOGIST SMEAR REVIEW

## 2023-07-07 LAB — FOLATE: Folate: 7.4 ng/mL (ref >5.9)

## 2023-07-10 ENCOUNTER — Telehealth: Payer: Self-pay | Admitting: Hematology and Oncology

## 2023-07-10 NOTE — Telephone Encounter (Signed)
 Patient has been scheduled for follow-up visit per 07/09/23 LOS.  LVM notifying pt of appt details, provided my direct number to pt if appt changes need to be made.

## 2023-07-13 DIAGNOSIS — Z9682 Presence of neurostimulator: Secondary | ICD-10-CM | POA: Diagnosis not present

## 2023-07-13 DIAGNOSIS — I6522 Occlusion and stenosis of left carotid artery: Secondary | ICD-10-CM | POA: Diagnosis not present

## 2023-07-13 DIAGNOSIS — D62 Acute posthemorrhagic anemia: Secondary | ICD-10-CM | POA: Diagnosis not present

## 2023-07-13 DIAGNOSIS — G43001 Migraine without aura, not intractable, with status migrainosus: Secondary | ICD-10-CM | POA: Diagnosis not present

## 2023-07-13 DIAGNOSIS — M5441 Lumbago with sciatica, right side: Secondary | ICD-10-CM | POA: Diagnosis not present

## 2023-07-13 DIAGNOSIS — J449 Chronic obstructive pulmonary disease, unspecified: Secondary | ICD-10-CM | POA: Diagnosis not present

## 2023-07-13 DIAGNOSIS — G8929 Other chronic pain: Secondary | ICD-10-CM | POA: Diagnosis not present

## 2023-07-13 DIAGNOSIS — K760 Fatty (change of) liver, not elsewhere classified: Secondary | ICD-10-CM | POA: Diagnosis not present

## 2023-07-13 DIAGNOSIS — D509 Iron deficiency anemia, unspecified: Secondary | ICD-10-CM | POA: Diagnosis not present

## 2023-07-13 DIAGNOSIS — K859 Acute pancreatitis without necrosis or infection, unspecified: Secondary | ICD-10-CM | POA: Diagnosis not present

## 2023-07-13 DIAGNOSIS — I69351 Hemiplegia and hemiparesis following cerebral infarction affecting right dominant side: Secondary | ICD-10-CM | POA: Diagnosis not present

## 2023-07-13 DIAGNOSIS — M1990 Unspecified osteoarthritis, unspecified site: Secondary | ICD-10-CM | POA: Diagnosis not present

## 2023-07-13 DIAGNOSIS — I110 Hypertensive heart disease with heart failure: Secondary | ICD-10-CM | POA: Diagnosis not present

## 2023-07-13 DIAGNOSIS — M109 Gout, unspecified: Secondary | ICD-10-CM | POA: Diagnosis not present

## 2023-07-13 DIAGNOSIS — F17210 Nicotine dependence, cigarettes, uncomplicated: Secondary | ICD-10-CM | POA: Diagnosis not present

## 2023-07-13 DIAGNOSIS — I5032 Chronic diastolic (congestive) heart failure: Secondary | ICD-10-CM | POA: Diagnosis not present

## 2023-07-13 DIAGNOSIS — E039 Hypothyroidism, unspecified: Secondary | ICD-10-CM | POA: Diagnosis not present

## 2023-07-13 DIAGNOSIS — G629 Polyneuropathy, unspecified: Secondary | ICD-10-CM | POA: Diagnosis not present

## 2023-07-13 DIAGNOSIS — E7800 Pure hypercholesterolemia, unspecified: Secondary | ICD-10-CM | POA: Diagnosis not present

## 2023-07-13 DIAGNOSIS — Z7902 Long term (current) use of antithrombotics/antiplatelets: Secondary | ICD-10-CM | POA: Diagnosis not present

## 2023-07-13 DIAGNOSIS — Z79899 Other long term (current) drug therapy: Secondary | ICD-10-CM | POA: Diagnosis not present

## 2023-07-30 DIAGNOSIS — F17210 Nicotine dependence, cigarettes, uncomplicated: Secondary | ICD-10-CM | POA: Diagnosis not present

## 2023-07-30 DIAGNOSIS — M5441 Lumbago with sciatica, right side: Secondary | ICD-10-CM | POA: Diagnosis not present

## 2023-07-30 DIAGNOSIS — Z9682 Presence of neurostimulator: Secondary | ICD-10-CM | POA: Diagnosis not present

## 2023-07-30 DIAGNOSIS — K760 Fatty (change of) liver, not elsewhere classified: Secondary | ICD-10-CM | POA: Diagnosis not present

## 2023-07-30 DIAGNOSIS — Z7902 Long term (current) use of antithrombotics/antiplatelets: Secondary | ICD-10-CM | POA: Diagnosis not present

## 2023-07-30 DIAGNOSIS — D509 Iron deficiency anemia, unspecified: Secondary | ICD-10-CM | POA: Diagnosis not present

## 2023-07-30 DIAGNOSIS — M109 Gout, unspecified: Secondary | ICD-10-CM | POA: Diagnosis not present

## 2023-07-30 DIAGNOSIS — E039 Hypothyroidism, unspecified: Secondary | ICD-10-CM | POA: Diagnosis not present

## 2023-07-30 DIAGNOSIS — I6522 Occlusion and stenosis of left carotid artery: Secondary | ICD-10-CM | POA: Diagnosis not present

## 2023-07-30 DIAGNOSIS — G629 Polyneuropathy, unspecified: Secondary | ICD-10-CM | POA: Diagnosis not present

## 2023-07-30 DIAGNOSIS — G43001 Migraine without aura, not intractable, with status migrainosus: Secondary | ICD-10-CM | POA: Diagnosis not present

## 2023-07-30 DIAGNOSIS — D62 Acute posthemorrhagic anemia: Secondary | ICD-10-CM | POA: Diagnosis not present

## 2023-07-30 DIAGNOSIS — G8929 Other chronic pain: Secondary | ICD-10-CM | POA: Diagnosis not present

## 2023-07-30 DIAGNOSIS — E7800 Pure hypercholesterolemia, unspecified: Secondary | ICD-10-CM | POA: Diagnosis not present

## 2023-07-30 DIAGNOSIS — I69351 Hemiplegia and hemiparesis following cerebral infarction affecting right dominant side: Secondary | ICD-10-CM | POA: Diagnosis not present

## 2023-07-30 DIAGNOSIS — I110 Hypertensive heart disease with heart failure: Secondary | ICD-10-CM | POA: Diagnosis not present

## 2023-07-30 DIAGNOSIS — I5032 Chronic diastolic (congestive) heart failure: Secondary | ICD-10-CM | POA: Diagnosis not present

## 2023-07-30 DIAGNOSIS — Z79899 Other long term (current) drug therapy: Secondary | ICD-10-CM | POA: Diagnosis not present

## 2023-07-30 DIAGNOSIS — M1990 Unspecified osteoarthritis, unspecified site: Secondary | ICD-10-CM | POA: Diagnosis not present

## 2023-07-30 DIAGNOSIS — K859 Acute pancreatitis without necrosis or infection, unspecified: Secondary | ICD-10-CM | POA: Diagnosis not present

## 2023-07-30 DIAGNOSIS — J449 Chronic obstructive pulmonary disease, unspecified: Secondary | ICD-10-CM | POA: Diagnosis not present

## 2023-08-03 DIAGNOSIS — I5032 Chronic diastolic (congestive) heart failure: Secondary | ICD-10-CM | POA: Diagnosis not present

## 2023-08-03 DIAGNOSIS — Z7902 Long term (current) use of antithrombotics/antiplatelets: Secondary | ICD-10-CM | POA: Diagnosis not present

## 2023-08-03 DIAGNOSIS — K859 Acute pancreatitis without necrosis or infection, unspecified: Secondary | ICD-10-CM | POA: Diagnosis not present

## 2023-08-03 DIAGNOSIS — K760 Fatty (change of) liver, not elsewhere classified: Secondary | ICD-10-CM | POA: Diagnosis not present

## 2023-08-03 DIAGNOSIS — M5441 Lumbago with sciatica, right side: Secondary | ICD-10-CM | POA: Diagnosis not present

## 2023-08-03 DIAGNOSIS — E7800 Pure hypercholesterolemia, unspecified: Secondary | ICD-10-CM | POA: Diagnosis not present

## 2023-08-03 DIAGNOSIS — M109 Gout, unspecified: Secondary | ICD-10-CM | POA: Diagnosis not present

## 2023-08-03 DIAGNOSIS — F17210 Nicotine dependence, cigarettes, uncomplicated: Secondary | ICD-10-CM | POA: Diagnosis not present

## 2023-08-03 DIAGNOSIS — I6522 Occlusion and stenosis of left carotid artery: Secondary | ICD-10-CM | POA: Diagnosis not present

## 2023-08-03 DIAGNOSIS — G8929 Other chronic pain: Secondary | ICD-10-CM | POA: Diagnosis not present

## 2023-08-03 DIAGNOSIS — I110 Hypertensive heart disease with heart failure: Secondary | ICD-10-CM | POA: Diagnosis not present

## 2023-08-03 DIAGNOSIS — Z79899 Other long term (current) drug therapy: Secondary | ICD-10-CM | POA: Diagnosis not present

## 2023-08-03 DIAGNOSIS — J449 Chronic obstructive pulmonary disease, unspecified: Secondary | ICD-10-CM | POA: Diagnosis not present

## 2023-08-03 DIAGNOSIS — D62 Acute posthemorrhagic anemia: Secondary | ICD-10-CM | POA: Diagnosis not present

## 2023-08-03 DIAGNOSIS — D509 Iron deficiency anemia, unspecified: Secondary | ICD-10-CM | POA: Diagnosis not present

## 2023-08-03 DIAGNOSIS — Z9682 Presence of neurostimulator: Secondary | ICD-10-CM | POA: Diagnosis not present

## 2023-08-03 DIAGNOSIS — G629 Polyneuropathy, unspecified: Secondary | ICD-10-CM | POA: Diagnosis not present

## 2023-08-03 DIAGNOSIS — G43001 Migraine without aura, not intractable, with status migrainosus: Secondary | ICD-10-CM | POA: Diagnosis not present

## 2023-08-03 DIAGNOSIS — I69351 Hemiplegia and hemiparesis following cerebral infarction affecting right dominant side: Secondary | ICD-10-CM | POA: Diagnosis not present

## 2023-08-03 DIAGNOSIS — M1990 Unspecified osteoarthritis, unspecified site: Secondary | ICD-10-CM | POA: Diagnosis not present

## 2023-08-03 DIAGNOSIS — E039 Hypothyroidism, unspecified: Secondary | ICD-10-CM | POA: Diagnosis not present

## 2023-08-05 DIAGNOSIS — J449 Chronic obstructive pulmonary disease, unspecified: Secondary | ICD-10-CM | POA: Diagnosis not present

## 2023-08-10 ENCOUNTER — Inpatient Hospital Stay: Attending: Hematology and Oncology | Admitting: Hematology and Oncology

## 2023-08-10 ENCOUNTER — Other Ambulatory Visit

## 2023-08-10 NOTE — Assessment & Plan Note (Deleted)
 Iron deficiency anemia felt to be due GI blood loss exacerbated by NSAID use.  In November 2024, she was found to have a hemoglobin of 6.4 with an MCV of 81. Iron studies revealed TIBC of 500, serum iron 71, iron saturation 14, and ferritin 5.  She was transfused 2 units of PRBC's and received IV Feraheme x 1 while admitted.  Her hemoglobin was 8.2 on discharge.  EGD and colonoscopy were done during hospitalization. EGD revealed gastropathy with a small amount of oozing, a small hiatal hernia and asymptomatic distal esophageal stricture. She was instructed to stop all NSAIDs.  She was admitted again to [facility] in April 2025 with new heart failure. Her hemoglobin was 7.9 with an MCV of 77, so she was transfused 1 unit of PRBC's.  She was admitted in May with pancreatitis.  Her hemoglobin dropped to 7.5 during hospitalization, so she was transfused 1 unit of packed red blood cells and received IV Feraheme x 1.   Her hemoglobin and iron stores had improved when I began seeing her in June, so recommended she continue oral iron supplement.

## 2023-08-10 NOTE — Progress Notes (Deleted)
 [facility]  [address] [place],  [place]  [phone]  Clinic Day:  08/10/2023  Referring physician: Ina Marcellus RAMAN, MD  ASSESSMENT & PLAN:   Assessment & Plan: No problem-specific Assessment & Plan notes found for this encounter.    The patient understands the plans discussed today and is in agreement with them.  She knows to contact our office if she develops concerns prior to her next appointment.   I provided *** minutes of face-to-face time during this encounter and > 50% was spent counseling as documented under my assessment and plan.    Shelley Underwood A Taisley Mordan, PA-C  [facility] - A DEPT OF [facility] [address] [place] [address] Dept: [phone] Dept Fax: [phone]   No orders of the defined types were placed in this encounter.     CHIEF COMPLAINT:  CC: Iron deficiency anemia  Current Treatment: Oral iron supplement  HISTORY OF PRESENT ILLNESS:  Shelley Underwood is a 73 y.o. female with iron deficiency anemia who was  referred by Dr. Marcellus Ina for assessment and management.  She was seen for routine follow-up in April and referred to our clinic, but did not keep her original appointment.  CBC revealed a hemoglobin of 9.7 with an MCV of 75.  WBCs 10.5, 77% neutrophils, 18% lymphocytes, 5% mid range.  Platelets 508,000.  CMP was normal except for creatinine 1.4, BUN 40, estimated GFR 41, and alkaline phosphatase 189.  CBC on April 25 revealed hemoglobin 9.4 with an MCV of 76, WBCs 11.5, 74% neutrophils, 20% lymphocytes, 6% mid range.  Platelets 509,000.  CBC on April 16 revealed hemoglobin 7.9 with an MCV of 74, WBCs 10.4, 83% neutrophils, 14% lymphocytes, 4% mid range.  Platelets 539,000.  CMP was normal except for alkaline phosphatase of 188.  CBC December 27 revealed a hemoglobin of 10.3 with an MCV of 83, normal white count and platelets.  TSH was elevated at 15 with a  normal free T4.    Review of her records show she presented to the [facility] ED in November 2024 when she presented with shortness of breath and headache for a week.  She has been using ibuprofen 4-5 times daily for the headaches and had also been on a course of prednisone for the shortness of breath.  She was found to have a hemoglobin of 6.4 with an MCV of 81.  She denied obvious bleeding.  Iron studies revealed TIBC of 500, serum iron 71, iron saturation 14, and ferritin 5.  She was transfused 2 units of PRBC's as well as IV Feraheme x 1 while admitted.  Her hemoglobin was 8.2 on discharge.  EGD and colonoscopy were done during hospitalization. EGD revealed gastropathy with a small amount of oozing, a small hiatal hernia and asymptomatic distal esophageal stricture.  Colonoscopy revealed 11 diminutive colon polyps, diverticulosis and external hemorrhoids, no source of bleeding.  Pathology revealed tubular adenomas and hyperplastic polyps.  She was hospitalized at [facility] in [place] in March 2025 with acute respiratory failure due to exacerbation of COPD.  CBC on admission revealed a hemoglobin of 8 with an MCV of 80 normal white count and platelets. Serum iron was 22, TIBC 475, iron saturation 5% and ferritin 8, which is consistent with iron deficiency.  B12 was normal.  Folate was borderline low at 5.7.  TSH was normal.  Her hemoglobin dropped to 7.4 during hospitalization,  but was 8 on discharge.  She was not transfused during that admission.  TTE revealed normal left ventricular systolic function with an ejection fraction of 55 to 60%, stage I diastolic dysfunction, mild aortic regurgitation and left atrial enlargement.  She was admitted again to [facility] on April 16, with new heart failure. Her hemoglobin was 7.9 with an MCV of 77, so she was transfused 1 unit of PRBC's.  She was admitted in May with pancreatitis.  Her hemoglobin dropped to 7.5 during hospitalization, so she was  transfused 1 unit of packed red blood cells and received IV Feraheme x 1.       INTERVAL HISTORY:   Shelley Underwood is here today for repeat clinical assessment. She denies fevers or chills. She denies pain. Her appetite is good. Her weight {Weight change:10426}.  REVIEW OF SYSTEMS:   Review of Systems - Oncology   VITALS:   There were no vitals taken for this visit.  Wt Readings from Last 3 Encounters:  07/07/23 240 lb 8 oz (109.1 kg)  11/06/14 247 lb 8 oz (112.3 kg)  10/27/14 247 lb 8 oz (112.3 kg)    There is no height or weight on file to calculate BMI.  Performance status (ECOG): {CHL ONC [id number]    PHYSICAL EXAM:   Physical Exam  LABS:      Latest Ref Rng & Units 07/07/2023    1:44 PM 11/08/2014    4:55 AM 11/07/2014    5:25 AM  CBC  WBC 4.0 - 10.5 K/uL 8.7  7.4  8.9   Hemoglobin 12.0 - 15.0 g/dL 88.1  89.6  89.4   Hematocrit 36.0 - 46.0 % 37.3  31.4  32.0   Platelets 150 - 400 K/uL 394  228  243       Latest Ref Rng & Units 11/07/2014    5:25 AM 11/06/2014    4:10 PM 10/27/2014   12:16 PM  CMP  Glucose 65 - 99 mg/dL 898   96   BUN 6 - 20 mg/dL 11   13   Creatinine 9.55 - 1.00 mg/dL 9.04  9.10  9.00   Sodium 135 - 145 mmol/L 135   139   Potassium 3.5 - 5.1 mmol/L 4.1   4.9   Chloride 101 - 111 mmol/L 103   110   CO2 22 - 32 mmol/L 25   22   Calcium  8.9 - 10.3 mg/dL 8.5   9.3   Total Protein 6.5 - 8.1 g/dL   6.6   Total Bilirubin 0.3 - 1.2 mg/dL   0.3   Alkaline Phos 38 - 126 U/L   102   AST 15 - 41 U/L   21   ALT 14 - 54 U/L   11      No results found for: CEA1, CEA / No results found for: CEA1, CEA No results found for: PSA1 No results found for: CAN199 No results found for: CAN125  No results found for: TOTALPROTELP, ALBUMINELP, A1GS, A2GS, BETS, BETA2SER, GAMS, MSPIKE, SPEI Lab Results  Component Value Date   TIBC 416 07/07/2023   FERRITIN 82 07/07/2023   IRONPCTSAT 10 (L) 07/07/2023   No results found for:  LDH  STUDIES:   No results found.    HISTORY:   Past Medical History:  Diagnosis Date   Anxiety    Arthritis    Carotid occlusion, left    Depression    Hypercholesteremia    Hypertension    Hypothyroidism  Migraine    Restless leg syndrome    Stroke [facility]) 09/2021    Past Surgical History:  Procedure Laterality Date   ABDOMINAL HYSTERECTOMY     CHOLECYSTECTOMY     pain stimulator      TONSILLECTOMY     TOTAL KNEE ARTHROPLASTY Right 02/04/2000   TOTAL KNEE ARTHROPLASTY Left 11/06/2014   TOTAL KNEE ARTHROPLASTY Left 11/06/2014   Procedure: LEFT TOTAL KNEE ARTHROPLASTY;  Surgeon: Marcey Raman, MD;  Location: [facility] OR;  Service: Orthopedics;  Laterality: Left;    Family History  Problem Relation Age of Onset   Heart attack Mother    Heart attack Father    Cancer - Lung Sister    Parkinson's disease Brother     Social History:  reports that she has been smoking cigarettes. She started smoking about 58 years ago. She has a 73.5 pack-year smoking history. She has never used smokeless tobacco. She reports that she does not drink alcohol and does not use drugs.The patient is {Blank single:19197::alone,accompanied by} *** today.  Allergies:  Allergies  Allergen Reactions   Macrobid [Nitrofurantoin Monohyd Macro] Anaphylaxis   Nitrofurantoin Anaphylaxis   Nitrofurantoin Macrocrystal Anaphylaxis   Oxycodone Other (See Comments)    Pt states causes  hallucinations  Other reaction(s): Mental Status Changes (intolerance)  Other reaction(s): Other  'makes me crazy'  Pt states causes  hallucinations   Oxycodone Hcl Other (See Comments)    Other reaction(s): Other 'makes me crazy'   Penicillins Rash and Dermatitis    Patient states this happened when she was younger and she believes she has grown out of it   Levaquin [Levofloxacin] Other (See Comments)    puritus    Current Medications: Current Outpatient Medications  Medication Sig Dispense Refill    acetaminophen  (TYLENOL ) 500 MG tablet Take 1,000 mg by mouth every 6 (six) hours as needed.     albuterol (VENTOLIN HFA) 108 (90 Base) MCG/ACT inhaler Inhale 2 puffs into the lungs every 6 (six) hours as needed.     allopurinol (ZYLOPRIM) 100 MG tablet Take 100 mg by mouth daily.     ALPRAZolam (XANAX) 0.5 MG tablet Take 0.5 mg by mouth 2 (two) times daily as needed.     amLODipine (NORVASC) 5 MG tablet Take 2.5 mg by mouth daily.     aspirin EC 81 MG tablet Take 81 mg by mouth daily.     B Complex Vitamins (B-COMPLEX/B-12 PO) Take 1 tablet by mouth daily. 2,519mcg     buPROPion  (WELLBUTRIN  XL) 150 MG 24 hr tablet Take 150 mg by mouth daily.     butalbital-acetaminophen -caffeine (FIORICET) 50-325-40 MG tablet Take 1 tablet by mouth 3 (three) times daily as needed.     clopidogrel (PLAVIX) 75 MG tablet Take 75 mg by mouth daily.     escitalopram  (LEXAPRO ) 10 MG tablet Take 10 mg by mouth daily.     ferrous sulfate 324 MG TBEC Take 324 mg by mouth daily with breakfast.     gabapentin (NEURONTIN) 300 MG capsule Take 300 mg by mouth 2 (two) times daily.     levocetirizine (XYZAL) 5 MG tablet Take 5 mg by mouth every evening.     levothyroxine  (SYNTHROID ) 150 MCG tablet Take 150 mcg by mouth daily before breakfast.     ondansetron  (ZOFRAN -ODT) 8 MG disintegrating tablet Take 8 mg by mouth every 8 (eight) hours as needed.     pantoprazole (PROTONIX) 20 MG tablet Take 20 mg by mouth daily.  potassium chloride SA (KLOR-CON M) 20 MEQ tablet Take 20 mEq by mouth once.     pramipexole  (MIRAPEX ) 0.75 MG tablet Take 0.75 mg by mouth at bedtime.     promethazine -dextromethorphan (PROMETHAZINE -DM) 6.25-15 MG/5ML syrup Take 5 mLs by mouth 4 (four) times daily as needed for cough.     rosuvastatin (CRESTOR) 20 MG tablet Take 20 mg by mouth at bedtime.     torsemide (DEMADEX) 20 MG tablet Take 20 mg by mouth daily.     vitamin C (ASCORBIC ACID) 250 MG tablet Take 250 mg by mouth daily.     No current  facility-administered medications for this visit.

## 2023-09-05 DIAGNOSIS — J449 Chronic obstructive pulmonary disease, unspecified: Secondary | ICD-10-CM | POA: Diagnosis not present

## 2023-10-06 DIAGNOSIS — J441 Chronic obstructive pulmonary disease with (acute) exacerbation: Secondary | ICD-10-CM | POA: Diagnosis not present

## 2023-10-06 DIAGNOSIS — J449 Chronic obstructive pulmonary disease, unspecified: Secondary | ICD-10-CM | POA: Diagnosis not present

## 2023-10-28 DIAGNOSIS — Z79899 Other long term (current) drug therapy: Secondary | ICD-10-CM | POA: Diagnosis not present

## 2023-10-28 DIAGNOSIS — Z0000 Encounter for general adult medical examination without abnormal findings: Secondary | ICD-10-CM | POA: Diagnosis not present

## 2023-10-28 DIAGNOSIS — E039 Hypothyroidism, unspecified: Secondary | ICD-10-CM | POA: Diagnosis not present

## 2023-10-28 DIAGNOSIS — M25571 Pain in right ankle and joints of right foot: Secondary | ICD-10-CM | POA: Diagnosis not present

## 2023-10-28 DIAGNOSIS — I10 Essential (primary) hypertension: Secondary | ICD-10-CM | POA: Diagnosis not present

## 2023-11-05 DIAGNOSIS — J441 Chronic obstructive pulmonary disease with (acute) exacerbation: Secondary | ICD-10-CM | POA: Diagnosis not present

## 2023-11-05 DIAGNOSIS — J449 Chronic obstructive pulmonary disease, unspecified: Secondary | ICD-10-CM | POA: Diagnosis not present

## 2023-11-09 DIAGNOSIS — Z7952 Long term (current) use of systemic steroids: Secondary | ICD-10-CM | POA: Diagnosis not present

## 2023-11-09 DIAGNOSIS — M25552 Pain in left hip: Secondary | ICD-10-CM | POA: Diagnosis not present

## 2023-11-13 DIAGNOSIS — M25552 Pain in left hip: Secondary | ICD-10-CM | POA: Diagnosis not present

## 2023-11-13 DIAGNOSIS — Z23 Encounter for immunization: Secondary | ICD-10-CM | POA: Diagnosis not present

## 2023-11-13 DIAGNOSIS — M25571 Pain in right ankle and joints of right foot: Secondary | ICD-10-CM | POA: Diagnosis not present

## 2023-11-20 DIAGNOSIS — M25552 Pain in left hip: Secondary | ICD-10-CM | POA: Diagnosis not present
